# Patient Record
Sex: Female | Born: 1955 | Race: White | Hispanic: No | Marital: Married | Smoking: Never smoker
Health system: Southern US, Community
[De-identification: ages and names within clinical notes are randomized; demographics above are authoritative.]

## PROBLEM LIST (undated history)

## (undated) DIAGNOSIS — K649 Unspecified hemorrhoids: Secondary | ICD-10-CM

## (undated) DIAGNOSIS — K6289 Other specified diseases of anus and rectum: Secondary | ICD-10-CM

## (undated) DIAGNOSIS — C210 Malignant neoplasm of anus, unspecified: Secondary | ICD-10-CM

## (undated) DIAGNOSIS — I10 Essential (primary) hypertension: Secondary | ICD-10-CM

## (undated) DIAGNOSIS — M1990 Unspecified osteoarthritis, unspecified site: Secondary | ICD-10-CM

## (undated) HISTORY — PX: APPENDECTOMY: SHX54

---

## 2006-04-05 HISTORY — PX: LAPAROSCOPIC VAGINAL HYSTERECTOMY WITH SALPINGO OOPHORECTOMY: SHX6681

## 2016-08-03 HISTORY — PX: COLONOSCOPY: SHX174

## 2016-09-27 ENCOUNTER — Other Ambulatory Visit: Payer: Self-pay | Admitting: General Surgery

## 2016-09-27 NOTE — Progress Notes (Signed)
History of Present Illness Leighton Ruff MD; 4/69/6295 1:53 PM) The patient is a 61 year old female who presents with a complaint of anal problems. 61 year old female who presents to the office for evaluation of a newly diagnosed anal mass. She states that she has had rectal bleeding for several months. She was evaluated by colonoscopy and found to have 1 small ascending colon polyp which was removed as well as a mass at the anal verge which was biopsied. Biopsies show AIN grade 3. She states that her bleeding has stopped. She denies any history of abnormal Pap smears. She denies any history of sexually transmitted diseases.   Past Surgical History Malachy Moan, [place]; 09/27/2016 1:32 PM) Appendectomy Colon Polyp Removal - Colonoscopy Hysterectomy (not due to cancer) - Complete  Diagnostic Studies History Malachy Moan, RMA; 09/27/2016 1:32 PM) Colonoscopy within last year Pap Smear >5 years ago  Allergies Malachy Moan, RMA; 09/27/2016 1:33 PM) No Known Allergies 09/27/2016  Medication History Malachy Moan, RMA; 09/27/2016 1:33 PM) No Current Medications Medications Reconciled  Social History Malachy Moan, RMA; 09/27/2016 1:32 PM) Alcohol use Remotely quit alcohol use. Caffeine use Coffee, Tea. Tobacco use Never smoker.  Family History Malachy Moan, [place]; 09/27/2016 1:32 PM) Family history unknown First Degree Relatives  Pregnancy / Birth History Malachy Moan, [place]; 09/27/2016 1:32 PM) Age at menarche 24 years. Gravida 2 Maternal age 28-25 Para 2  Other Problems Malachy Moan, [place]; 09/27/2016 1:32 PM) High blood pressure     Review of Systems Malachy Moan RMA; 09/27/2016 1:32 PM) General Present- Weight Gain. Not Present- Appetite Loss, Chills, Fatigue, Fever, Night Sweats and Weight Loss. Skin Not Present- Change in Wart/Mole, Dryness, Hives, Jaundice, New Lesions, Non-Healing Wounds, Rash and Ulcer. HEENT Not  Present- Earache, Hearing Loss, Hoarseness, Nose Bleed, Oral Ulcers, Ringing in the Ears, Seasonal Allergies, Sinus Pain, Sore Throat, Visual Disturbances, Wears glasses/contact lenses and Yellow Eyes. Respiratory Not Present- Bloody sputum, Chronic Cough, Difficulty Breathing, Snoring and Wheezing. Breast Not Present- Breast Mass, Breast Pain, Nipple Discharge and Skin Changes. Cardiovascular Not Present- Chest Pain, Difficulty Breathing Lying Down, Leg Cramps, Palpitations, Rapid Heart Rate, Shortness of Breath and Swelling of Extremities. Gastrointestinal Present- Bloody Stool. Not Present- Abdominal Pain, Bloating, Change in Bowel Habits, Chronic diarrhea, Constipation, Difficulty Swallowing, Excessive gas, Gets full quickly at meals, Hemorrhoids, Indigestion, Nausea, Rectal Pain and Vomiting. Female Genitourinary Not Present- Frequency, Nocturia, Painful Urination, Pelvic Pain and Urgency. Musculoskeletal Not Present- Back Pain, Joint Pain, Joint Stiffness, Muscle Pain, Muscle Weakness and Swelling of Extremities. Neurological Not Present- Decreased Memory, Fainting, Headaches, Numbness, Seizures, Tingling, Tremor, Trouble walking and Weakness. Psychiatric Not Present- Anxiety, Bipolar, Change in Sleep Pattern, Depression, Fearful and Frequent crying. Endocrine Not Present- Cold Intolerance, Excessive Hunger, Hair Changes, Heat Intolerance, Hot flashes and New Diabetes. Hematology Not Present- Blood Thinners, Easy Bruising, Excessive bleeding, Gland problems, HIV and Persistent Infections.  Vitals Malachy Moan RMA; 09/27/2016 1:34 PM) 09/27/2016 1:33 PM Weight: 166.6 lb Height: 67in Body Surface Area: 1.87 m Body Mass Index: 26.09 kg/m  Temp.: 98.37F  Pulse: 70 (Regular)  BP: 170/80 (Sitting, Left Arm, Standard)      Physical Exam Leighton Ruff MD; 2/84/1324 1:54 PM)  General Mental Status-Alert. General Appearance-Not in acute distress. Build & Nutrition-Well  nourished. Posture-Normal posture. Gait-Normal.  Head and Neck Head-normocephalic, atraumatic with no lesions or palpable masses. Trachea-midline.  Chest and Lung Exam Chest and lung exam reveals -on auscultation, normal breath sounds, no adventitious sounds and normal vocal resonance.  Cardiovascular  Cardiovascular examination reveals -normal heart sounds, regular rate and rhythm with no murmurs and femoral artery auscultation bilaterally reveals normal pulses, no bruits, no thrills.  Abdomen Inspection Inspection of the abdomen reveals - No Hernias. Palpation/Percussion Palpation and Percussion of the abdomen reveal - Soft, Non Tender, No Rigidity (guarding), No hepatosplenomegaly and No Palpable abdominal masses.  Rectal Anorectal Exam External - normal external exam. Internal - Note: Posterior anal canal mass fixed to underlying mucosa but does not appear to be fixed to underlying muscle tissue.  Neurologic Neurologic evaluation reveals -alert and oriented x 3 with no impairment of recent or remote memory, normal attention span and ability to concentrate, normal sensation and normal coordination.  Musculoskeletal Normal Exam - Bilateral-Upper Extremity Strength Normal and Lower Extremity Strength Normal.    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 Biopsy show AIN grade 3. On physical evaluation this appears to be more advanced and invasive. 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 We will proceed with CT scan of the chest abdomen and pelvis to evaluate for metastatic disease. As long as these are negative, we will proceed to the operating room for exam under anesthesia and anal ultrasound. I will either perform an incisional biopsy or excisional biopsy depending on evaluation of the ultrasound in getting  clean margins underneath the tumor.

## 2016-09-28 ENCOUNTER — Other Ambulatory Visit (HOSPITAL_COMMUNITY): Payer: Self-pay | Admitting: General Surgery

## 2016-09-28 DIAGNOSIS — C221 Intrahepatic bile duct carcinoma: Secondary | ICD-10-CM

## 2016-10-04 ENCOUNTER — Ambulatory Visit (HOSPITAL_COMMUNITY)
Admission: RE | Admit: 2016-10-04 | Discharge: 2016-10-04 | Disposition: A | Discharge status: Home / Self Care | Payer: Self-pay | Source: Ambulatory Visit | Attending: General Surgery | Admitting: General Surgery

## 2016-10-04 ENCOUNTER — Encounter (HOSPITAL_COMMUNITY): Payer: Self-pay | Admitting: Radiology

## 2016-10-04 DIAGNOSIS — C221 Intrahepatic bile duct carcinoma: Secondary | ICD-10-CM | POA: Insufficient documentation

## 2016-10-04 DIAGNOSIS — I700 Atherosclerosis of aorta: Secondary | ICD-10-CM | POA: Insufficient documentation

## 2016-10-04 LAB — POCT I-STAT CREATININE: CREATININE: 0.8 mg/dL (ref 0.44–1.00)

## 2016-10-04 MED ORDER — IOPAMIDOL (ISOVUE-300) INJECTION 61%
100.0000 mL | Freq: Once | INTRAVENOUS | Status: AC | PRN
  Administered 2016-10-04: 100 mL via INTRAVENOUS

## 2016-10-04 MED ORDER — IOPAMIDOL (ISOVUE-300) INJECTION 61%
INTRAVENOUS | Status: AC
  Administered 2016-10-04: 100 mL via INTRAVENOUS
  Filled 2016-10-04: qty 100

## 2016-10-08 ENCOUNTER — Encounter (HOSPITAL_BASED_OUTPATIENT_CLINIC_OR_DEPARTMENT_OTHER): Payer: Self-pay | Admitting: *Deleted

## 2016-10-11 ENCOUNTER — Encounter (HOSPITAL_BASED_OUTPATIENT_CLINIC_OR_DEPARTMENT_OTHER): Payer: Self-pay | Admitting: *Deleted

## 2016-10-11 NOTE — Progress Notes (Signed)
NPO AFTER MN.  ARRIVE AT 0700.  NEEDS HG.

## 2016-10-20 DIAGNOSIS — I10 Essential (primary) hypertension: Secondary | ICD-10-CM | POA: Insufficient documentation

## 2016-10-21 ENCOUNTER — Ambulatory Visit (HOSPITAL_BASED_OUTPATIENT_CLINIC_OR_DEPARTMENT_OTHER)
Admission: RE | Admit: 2016-10-21 | Discharge: 2016-10-21 | Disposition: A | Discharge status: Home / Self Care | Payer: Self-pay | Source: Ambulatory Visit | Attending: General Surgery | Admitting: General Surgery

## 2016-10-21 ENCOUNTER — Encounter (HOSPITAL_BASED_OUTPATIENT_CLINIC_OR_DEPARTMENT_OTHER)
Admission: RE | Disposition: A | Discharge status: Home / Self Care | Payer: Self-pay | Source: Ambulatory Visit | Attending: General Surgery

## 2016-10-21 ENCOUNTER — Ambulatory Visit (HOSPITAL_BASED_OUTPATIENT_CLINIC_OR_DEPARTMENT_OTHER): Payer: Self-pay | Admitting: Anesthesiology

## 2016-10-21 ENCOUNTER — Encounter (HOSPITAL_BASED_OUTPATIENT_CLINIC_OR_DEPARTMENT_OTHER): Payer: Self-pay

## 2016-10-21 DIAGNOSIS — C211 Malignant neoplasm of anal canal: Secondary | ICD-10-CM | POA: Insufficient documentation

## 2016-10-21 HISTORY — DX: Other specified diseases of anus and rectum: K62.89

## 2016-10-21 HISTORY — PX: RECTAL EXAM UNDER ANESTHESIA: SHX6399

## 2016-10-21 LAB — POCT HEMOGLOBIN-HEMACUE: HEMOGLOBIN: 13.9 g/dL (ref 12.0–15.0)

## 2016-10-21 SURGERY — EXAM UNDER ANESTHESIA, RECTUM
Anesthesia: Monitor Anesthesia Care | Site: Anus

## 2016-10-21 MED ORDER — ACETAMINOPHEN 650 MG RE SUPP
650.0000 mg | RECTAL | Status: DC | PRN
  Filled 2016-10-21: qty 1

## 2016-10-21 MED ORDER — MIDAZOLAM HCL 2 MG/2ML IJ SOLN
INTRAMUSCULAR | Status: AC
  Filled 2016-10-21: qty 2

## 2016-10-21 MED ORDER — GABAPENTIN 300 MG PO CAPS
ORAL_CAPSULE | ORAL | Status: AC
  Filled 2016-10-21: qty 1

## 2016-10-21 MED ORDER — FENTANYL CITRATE (PF) 100 MCG/2ML IJ SOLN
INTRAMUSCULAR | Status: AC
  Filled 2016-10-21: qty 2

## 2016-10-21 MED ORDER — CELECOXIB 400 MG PO CAPS
400.0000 mg | ORAL_CAPSULE | ORAL | Status: AC
  Administered 2016-10-21: 400 mg via ORAL
  Filled 2016-10-21: qty 1

## 2016-10-21 MED ORDER — DEXAMETHASONE SODIUM PHOSPHATE 10 MG/ML IJ SOLN
INTRAMUSCULAR | Status: AC
  Filled 2016-10-21: qty 1

## 2016-10-21 MED ORDER — SODIUM CHLORIDE 0.9% FLUSH
3.0000 mL | INTRAVENOUS | Status: DC | PRN
  Filled 2016-10-21: qty 3

## 2016-10-21 MED ORDER — PROMETHAZINE HCL 25 MG/ML IJ SOLN
6.2500 mg | INTRAMUSCULAR | Status: DC | PRN
  Filled 2016-10-21: qty 1

## 2016-10-21 MED ORDER — PROPOFOL 500 MG/50ML IV EMUL
INTRAVENOUS | Status: AC
  Filled 2016-10-21: qty 50

## 2016-10-21 MED ORDER — SODIUM CHLORIDE 0.9 % IV SOLN
250.0000 mL | INTRAVENOUS | Status: DC | PRN
  Filled 2016-10-21: qty 250

## 2016-10-21 MED ORDER — SODIUM CHLORIDE 0.9% FLUSH
3.0000 mL | Freq: Two times a day (BID) | INTRAVENOUS | Status: DC
  Filled 2016-10-21: qty 3

## 2016-10-21 MED ORDER — ACETAMINOPHEN 500 MG PO TABS
1000.0000 mg | ORAL_TABLET | ORAL | Status: AC
  Administered 2016-10-21: 1000 mg via ORAL
  Filled 2016-10-21: qty 2

## 2016-10-21 MED ORDER — GABAPENTIN 300 MG PO CAPS
300.0000 mg | ORAL_CAPSULE | ORAL | Status: AC
  Administered 2016-10-21: 300 mg via ORAL
  Filled 2016-10-21: qty 1

## 2016-10-21 MED ORDER — DEXAMETHASONE SODIUM PHOSPHATE 4 MG/ML IJ SOLN
INTRAMUSCULAR | Status: DC | PRN
  Administered 2016-10-21: 5 mg via INTRAVENOUS

## 2016-10-21 MED ORDER — CELECOXIB 200 MG PO CAPS
ORAL_CAPSULE | ORAL | Status: AC
  Filled 2016-10-21: qty 2

## 2016-10-21 MED ORDER — FENTANYL CITRATE (PF) 100 MCG/2ML IJ SOLN
25.0000 ug | INTRAMUSCULAR | Status: DC | PRN
  Filled 2016-10-21: qty 1

## 2016-10-21 MED ORDER — ACETAMINOPHEN 325 MG PO TABS
650.0000 mg | ORAL_TABLET | ORAL | Status: DC | PRN
  Filled 2016-10-21: qty 2

## 2016-10-21 MED ORDER — BUPIVACAINE-EPINEPHRINE 0.5% -1:200000 IJ SOLN
INTRAMUSCULAR | Status: DC | PRN
  Administered 2016-10-21: 30 mL

## 2016-10-21 MED ORDER — MIDAZOLAM HCL 5 MG/5ML IJ SOLN
INTRAMUSCULAR | Status: DC | PRN
  Administered 2016-10-21: 2 mg via INTRAVENOUS

## 2016-10-21 MED ORDER — OXYCODONE HCL 5 MG PO TABS
5.0000 mg | ORAL_TABLET | ORAL | Status: DC | PRN
  Filled 2016-10-21: qty 2

## 2016-10-21 MED ORDER — FENTANYL CITRATE (PF) 100 MCG/2ML IJ SOLN
INTRAMUSCULAR | Status: DC | PRN
  Administered 2016-10-21: 50 ug via INTRAVENOUS

## 2016-10-21 MED ORDER — ONDANSETRON HCL 4 MG/2ML IJ SOLN
INTRAMUSCULAR | Status: AC
  Filled 2016-10-21: qty 2

## 2016-10-21 MED ORDER — ONDANSETRON HCL 4 MG/2ML IJ SOLN
INTRAMUSCULAR | Status: DC | PRN
  Administered 2016-10-21: 4 mg via INTRAVENOUS

## 2016-10-21 MED ORDER — PROPOFOL 500 MG/50ML IV EMUL
INTRAVENOUS | Status: DC | PRN
  Administered 2016-10-21: 200 ug/kg/min via INTRAVENOUS

## 2016-10-21 MED ORDER — OXYCODONE HCL 5 MG PO TABS: 5.0000 mg | ORAL_TABLET | ORAL | 0 refills | Status: DC | PRN

## 2016-10-21 MED ORDER — ACETAMINOPHEN 500 MG PO TABS
ORAL_TABLET | ORAL | Status: AC
  Filled 2016-10-21: qty 2

## 2016-10-21 MED ORDER — LACTATED RINGERS IV SOLN
INTRAVENOUS | Status: DC
  Administered 2016-10-21: 08:00:00 via INTRAVENOUS
  Filled 2016-10-21: qty 1000

## 2016-10-21 MED ORDER — LIDOCAINE 2% (20 MG/ML) 5 ML SYRINGE
INTRAMUSCULAR | Status: DC | PRN
  Administered 2016-10-21: 50 mg via INTRAVENOUS

## 2016-10-21 SURGICAL SUPPLY — 49 items
BENZOIN TINCTURE PRP APPL 2/3 (GAUZE/BANDAGES/DRESSINGS) ×3 IMPLANT
BLADE HEX COATED 2.75 (ELECTRODE) ×3 IMPLANT
BLADE SURG 10 STRL SS (BLADE) IMPLANT
BLADE SURG 15 STRL LF DISP TIS (BLADE) IMPLANT
BLADE SURG 15 STRL SS (BLADE)
BRIEF STRETCH FOR OB PAD LRG (UNDERPADS AND DIAPERS) ×3 IMPLANT
CANISTER SUCT 3000ML PPV (MISCELLANEOUS) ×3 IMPLANT
COVER BACK TABLE 60X90IN (DRAPES) ×3 IMPLANT
COVER MAYO STAND STRL (DRAPES) ×3 IMPLANT
DECANTER SPIKE VIAL GLASS SM (MISCELLANEOUS) ×3 IMPLANT
DRAPE LAPAROTOMY 100X72 PEDS (DRAPES) ×3 IMPLANT
DRAPE LG THREE QUARTER DISP (DRAPES) ×6 IMPLANT
DRAPE UNDERBUTTOCKS STRL (DRAPE) IMPLANT
DRAPE UTILITY XL STRL (DRAPES) ×3 IMPLANT
DRSG PAD ABDOMINAL 8X10 ST (GAUZE/BANDAGES/DRESSINGS) ×3 IMPLANT
ELECT BLADE 6.5 .24CM SHAFT (ELECTRODE) IMPLANT
ELECT REM PT RETURN 9FT ADLT (ELECTROSURGICAL) ×3
ELECTRODE REM PT RTRN 9FT ADLT (ELECTROSURGICAL) ×1 IMPLANT
GAUZE SPONGE 4X4 12PLY STRL (GAUZE/BANDAGES/DRESSINGS) ×3 IMPLANT
GAUZE SPONGE 4X4 12PLY STRL LF (GAUZE/BANDAGES/DRESSINGS) ×3 IMPLANT
GAUZE SPONGE 4X4 16PLY XRAY LF (GAUZE/BANDAGES/DRESSINGS) IMPLANT
GLOVE BIO SURGEON STRL SZ 6.5 (GLOVE) ×4 IMPLANT
GLOVE BIO SURGEONS STRL SZ 6.5 (GLOVE) ×2
GLOVE INDICATOR 7.0 STRL GRN (GLOVE) ×6 IMPLANT
GOWN SPEC L3 XXLG W/TWL (GOWN DISPOSABLE) ×3 IMPLANT
GOWN STRL REUS W/TWL LRG LVL3 (GOWN DISPOSABLE) ×3 IMPLANT
KIT RM TURNOVER CYSTO AR (KITS) ×3 IMPLANT
LEGGING LITHOTOMY PAIR STRL (DRAPES) IMPLANT
LOOP VESSEL MAXI BLUE (MISCELLANEOUS) IMPLANT
NDL SAFETY ECLIPSE 18X1.5 (NEEDLE) IMPLANT
NEEDLE HYPO 18GX1.5 SHARP (NEEDLE)
NEEDLE HYPO 22GX1.5 SAFETY (NEEDLE) ×3 IMPLANT
NS IRRIG 500ML POUR BTL (IV SOLUTION) ×3 IMPLANT
PACK BASIN DAY SURGERY FS (CUSTOM PROCEDURE TRAY) ×3 IMPLANT
PAD ABD 8X10 STRL (GAUZE/BANDAGES/DRESSINGS) ×3 IMPLANT
PAD ARMBOARD 7.5X6 YLW CONV (MISCELLANEOUS) IMPLANT
PENCIL BUTTON HOLSTER BLD 10FT (ELECTRODE) ×3 IMPLANT
SPONGE SURGIFOAM ABS GEL 12-7 (HEMOSTASIS) IMPLANT
SUT CHROMIC 2 0 SH (SUTURE) IMPLANT
SUT ETHIBOND 0 (SUTURE) IMPLANT
SUT GUT CHROMIC 3 0 (SUTURE) IMPLANT
SUT VIC AB 4-0 P-3 18XBRD (SUTURE) IMPLANT
SUT VIC AB 4-0 P3 18 (SUTURE)
SYR CONTROL 10ML LL (SYRINGE) ×3 IMPLANT
TOWEL OR 17X24 6PK STRL BLUE (TOWEL DISPOSABLE) ×3 IMPLANT
TRAY DSU PREP LF (CUSTOM PROCEDURE TRAY) ×3 IMPLANT
TUBE CONNECTING 12'X1/4 (SUCTIONS) ×1
TUBE CONNECTING 12X1/4 (SUCTIONS) ×2 IMPLANT
YANKAUER SUCT BULB TIP NO VENT (SUCTIONS) ×3 IMPLANT

## 2016-10-21 NOTE — Anesthesia Preprocedure Evaluation (Addendum)
Anesthesia Evaluation  Patient identified by MRN, date of birth, ID band Patient awake    Reviewed: Allergy & Precautions, NPO status , Patient's Chart, lab work & pertinent test results  Airway Mallampati: II  TM Distance: >3 FB Neck ROM: Full    Dental no notable dental hx. (+) Teeth Intact, Dental Advisory Given   Pulmonary neg pulmonary ROS,    Pulmonary exam normal breath sounds clear to auscultation       Cardiovascular Normal cardiovascular exam Rhythm:Regular Rate:Normal  Borderline hypertension   Neuro/Psych negative neurological ROS  negative psych ROS   GI/Hepatic negative GI ROS, Neg liver ROS,   Endo/Other  negative endocrine ROS  Renal/GU negative Renal ROS  negative genitourinary   Musculoskeletal negative musculoskeletal ROS (+)   Abdominal   Peds negative pediatric ROS (+)  Hematology negative hematology ROS (+)   Anesthesia Other Findings   Reproductive/Obstetrics negative OB ROS                            Anesthesia Physical Anesthesia Plan  ASA: II  Anesthesia Plan: MAC   Post-op Pain Management:    Induction: Intravenous  PONV Risk Score and Plan: 1 and Ondansetron and Propofol  Airway Management Planned: Simple Face Mask  Additional Equipment:   Intra-op Plan:   Post-operative Plan:   Informed Consent: I have reviewed the patients History and Physical, chart, labs and discussed the procedure including the risks, benefits and alternatives for the proposed anesthesia with the patient or authorized representative who has indicated his/her understanding and acceptance.   Dental advisory given  Plan Discussed with: CRNA, Surgeon and Anesthesiologist  Anesthesia Plan Comments:        Anesthesia Quick Evaluation

## 2016-10-21 NOTE — Anesthesia Procedure Notes (Signed)
Procedure Name: MAC Date/Time: 10/21/2016 9:05 AM Performed by: Wanita Chamberlain Pre-anesthesia Checklist: Patient identified, Suction available, Patient being monitored and Timeout performed Patient Re-evaluated:Patient Re-evaluated prior to induction Oxygen Delivery Method: Nasal cannula Preoxygenation: Pre-oxygenation with 100% oxygen Induction Type: IV induction Placement Confirmation: CO2 detector Dental Injury: Teeth and Oropharynx as per pre-operative assessment

## 2016-10-21 NOTE — Op Note (Addendum)
10/21/2016  9:41 AM  PATIENT:  Nicole Huynh  61 y.o. female  Patient Care Team: Elisabeth Cara, MD as PCP - General (Family Medicine)  PRE-OPERATIVE DIAGNOSIS:  anal mass  POST-OPERATIVE DIAGNOSIS:  anal mass  PROCEDURE:  ANAL EXAM UNDER ANESTHESIA, RECTAL ULTRASOUND AND EXCISIONAL BIOPSY OF ANAL MASS   Surgeon(s): Leighton Ruff, MD  ASSISTANT: none   ANESTHESIA:   local and MAC  SPECIMEN:  Source of Specimen:  L posterior anal canal mass  DISPOSITION OF SPECIMEN:  PATHOLOGY  COUNTS:  YES  PLAN OF CARE: Discharge to home after PACU  PATIENT DISPOSITION:  PACU - hemodynamically stable.  INDICATION: 61 y.o. F with bleeding anal mass.  Biopsy during colonoscopy showed AIN   OR FINDINGS:  mass in left posterior anal canal which appears to be confined to mucosa on anal [place] (see below)  DESCRIPTION: the patient was identified in the preoperative holding area and taken to the OR where they were laid on the operating room table.  MAC anesthesia was induced without difficulty. The patient was then positioned in prone jackknife position with buttocks gently taped apart.  A timeout was performed. I then easily anal ultrasound rectal probe to delineate the anal tumor. I could clearly see internal sphincter and the mass bulging the internal sphincter hours. There did not appear to be any invasion into the internal sphincter. I decided to seat with excision. The patient was then prepped and draped in usual sterile fashion.  SCDs were noted to be in place prior to the initiation of anesthesia. A surgical timeout was performed indicating the correct patient, procedure, positioning and need for preoperative antibiotics.  A rectal block was performed using Marcaine with epinephrine.    I began with a digital rectal exam.  The mass was palpated in the left posterior anal canal at the dentate line.  I then placed a Hill-Ferguson anoscope into the anal canal and evaluated this completely.  The mass  was approximately 1.5 cm in width and approximately 2 cm in length. I marked out approximately 5 mm margins using electrocautery. I then dissected the tumor off of the sphincter complex distally using Metzenbaum scissors. Once I got to the middle of the tumor I did take a portion of internal sphincter intentionally to allow for a clear posterior margin. I continued around the tumor and divided the rectal mucosa proximally also using Metzenbaum scissors. The specimen was then sent to pathology for further examination. The wound was closed using 2-0 chromic sutures. Additional Marcaine was placed under the incision site. The patient tolerated procedure well. All counts were correct per operating room staff. The patient was sent to the postanesthesia care unit in stable condition.    .     I have reviewed the [place] and the patient has no other controlled substances listed.

## 2016-10-21 NOTE — Anesthesia Postprocedure Evaluation (Signed)
Anesthesia Post Note  Patient: Nicole Huynh  Procedure(s) Performed: Procedure(s) (LRB): ANAL EXAM UNDER ANESTHESIA, RECTAL ULTRASOUND AND POSSIBLE EXCISION OF ANAL MASS (N/A)     Patient location during evaluation: PACU Anesthesia Type: MAC Level of consciousness: awake and alert Pain management: pain level controlled Vital Signs Assessment: post-procedure vital signs reviewed and stable Respiratory status: spontaneous breathing, nonlabored ventilation, respiratory function stable and patient connected to nasal cannula oxygen Cardiovascular status: stable and blood pressure returned to baseline Anesthetic complications: no    Last Vitals:  Vitals:   10/21/16 0950 10/21/16 1000  BP: 109/66 105/60  Pulse: 89 80  Resp: 16 16  Temp: 36.8 C     Last Pain:  Vitals:   10/21/16 0950  TempSrc:   PainSc: 0-No pain                 Labradford Schnitker S

## 2016-10-21 NOTE — Discharge Instructions (Addendum)
Post Anesthesia Home Care Instructions  Activity: Get plenty of rest for the remainder of the day. A responsible individual must stay with you for 24 hours following the procedure.  For the next 24 hours, DO NOT: -Drive a car -Operate machinery -Drink alcoholic beverages -Take any medication unless instructed by your physician -Make any legal decisions or sign important papers.  Meals: Start with liquid foods such as gelatin or soup. Progress to regular foods as tolerated. Avoid greasy, spicy, heavy foods. If nausea and/or vomiting occur, drink only clear liquids until the nausea and/or vomiting subsides. Call your physician if vomiting continues.  Special Instructions/Symptoms: Your throat may feel dry or sore from the anesthesia or the breathing tube placed in your throat during surgery. If this causes discomfort, gargle with warm salt water. The discomfort should disappear within 24 hours.  If you had a scopolamine patch placed behind your ear for the management of post- operative nausea and/or vomiting:  1. The medication in the patch is effective for 72 hours, after which it should be removed.  Wrap patch in a tissue and discard in the trash. Wash hands thoroughly with soap and water. 2. You may remove the patch earlier than 72 hours if you experience unpleasant side effects which may include dry mouth, dizziness or visual disturbances. 3. Avoid touching the patch. Wash your hands with soap and water after contact with the patch.    ANORECTAL SURGERY: POST OP INSTRUCTIONS 1. Take your usually prescribed home medications unless otherwise directed. 2. DIET: During the first few hours after surgery sip on some liquids until you are able to urinate.  It is normal to not urinate for several hours after this surgery.  If you feel uncomfortable, please contact the office for instructions.  After you are able to urinate,you may eat, if you feel like it.  Follow a light bland diet the first 24  hours after arrival home, such as soup, liquids, crackers, etc.  Be sure to include lots of fluids daily (6-8 glasses).  Avoid fast food or heavy meals, as your are more likely to get nauseated.  Eat a low fat diet the next few days after surgery.  Limit caffeine intake to 1-2 servings a day. 3. PAIN CONTROL: a. Pain is best controlled by a usual combination of several different methods TOGETHER: i. Muscle relaxation: Soak in a warm bath (or Sitz bath) three times a day and after bowel movements.  Continue to do this until all pain is resolved. ii. Over the counter pain medication iii. Prescription pain medication b. Most patients will experience some swelling and discomfort in the anus/rectal area and incisions.  Heat such as warm towels, sitz baths, warm baths, etc to help relax tight/sore spots and speed recovery.  Some people prefer to use ice, especially in the first couple days after surgery, as it may decrease the pain and swelling, or alternate between ice & heat.  Experiment to what works for you.  Swelling and bruising can take several weeks to resolve.  Pain can take even longer to completely resolve. c. It is helpful to take an over-the-counter pain medication regularly for the first few weeks.  Choose one of the following that works best for you: i. Naproxen (Aleve, etc)  Two 220mg tabs twice a day ii. Ibuprofen (Advil, etc) Three 200mg tabs four times a day (every meal & bedtime) d. A  prescription for pain medication (such as percocet, oxycodone, hydrocodone, etc) should be given to you   upon discharge.  Take your pain medication as prescribed.  i. If you are having problems/concerns with the prescription medicine (does not control pain, nausea, vomiting, rash, itching, etc), please call us [phone] to see if we need to switch you to a different pain medicine that will work better for you and/or control your side effect better. ii. If you need a refill on your pain medication, please  contact your pharmacy.  They will contact our office to request authorization. Prescriptions will not be filled after 5 pm or on week-ends. 4. KEEP YOUR BOWELS REGULAR and AVOID CONSTIPATION a. The goal is one to two soft bowel movements a day.  You should at least have a bowel movement every other day. b. Avoid getting constipated.  Between the surgery and the pain medications, it is common to experience some constipation. This can be very painful after rectal surgery.  Increasing fluid intake and taking a fiber supplement (such as Metamucil, Citrucel, FiberCon, etc) 1-2 times a day regularly will usually help prevent this problem from occurring.  A stool softener like colace is also recommended.  This can be purchased over the counter at your pharmacy.  You can take it up to 3 times a day.  If you do not have a bowel movement after 24 hrs since your surgery, take one does of milk of magnesia.  If you still haven't had a bowel movement 8-12 hours after that dose, take another dose.  If you don't have a bowel movement 48 hrs after surgery, purchase a Fleets enema from the drug store and administer gently per package instructions.  If you still are having trouble with your bowel movements after that, please call the office for further instructions. c. If you develop diarrhea or have many loose bowel movements, simplify your diet to bland foods & liquids for a few days.  Stop any stool softeners and decrease your fiber supplement.  Switching to mild anti-diarrheal medications (Kayopectate, Pepto Bismol) can help.  If this worsens or does not improve, please call us.  5. Wound Care a. Remove your bandages before your first bowel movement or 8 hours after surgery.     b. Remove any wound packing material at this tim,e as well.  You do not need to repack the wound unless instructed otherwise.  Wear an absorbent pad or soft cotton gauze in your underwear to catch any drainage and help keep the area clean. You  should change this every 2-3 hours while awake. c. Keep the area clean and dry.  Bathe / shower every day, especially after bowel movements.  Keep the area clean by showering / bathing over the incision / wound.   It is okay to soak an open wound to help wash it.  Wet wipes or showers / gentle washing after bowel movements is often less traumatic than regular toilet paper. d. You may have some styrofoam-like soft packing in the rectum which will come out with the first bowel movement.  e. You will often notice bleeding with bowel movements.  This should slow down by the end of the first week of surgery f. Expect some drainage.  This should slow down, too, by the end of the first week of surgery.  Wear an absorbent pad or soft cotton gauze in your underwear until the drainage stops. g. Do Not sit on a rubber or pillow ring.  This can make you symptoms worse.  You may sit on a soft pillow if needed.    6. ACTIVITIES as tolerated:   a. You may resume regular (light) daily activities beginning the next day--such as daily self-care, walking, climbing stairs--gradually increasing activities as tolerated.  If you can walk 30 minutes without difficulty, it is safe to try more intense activity such as jogging, treadmill, bicycling, low-impact aerobics, swimming, etc. b. Save the most intensive and strenuous activity for last such as sit-ups, heavy lifting, contact sports, etc  Refrain from any heavy lifting or straining until you are off narcotics for pain control.   c. You may drive when you are no longer taking prescription pain medication, you can comfortably sit for long periods of time, and you can safely maneuver your car and apply brakes. d. You may have sexual intercourse when it is comfortable.  7. FOLLOW UP in our office a. Please call [facility] at [phone] to set up an appointment to see your surgeon in the office for a follow-up appointment approximately 3-4 weeks after your surgery. b. Make sure that  you call for this appointment the day you arrive home to insure a convenient appointment time. 10. IF YOU HAVE DISABILITY OR FAMILY LEAVE FORMS, BRING THEM TO THE OFFICE FOR PROCESSING.  DO NOT GIVE THEM TO YOUR DOCTOR.     WHEN TO CALL US [phone]: 1. Poor pain control 2. Reactions / problems with new medications (rash/itching, nausea, etc)  3. Fever over 101.5 F (38.5 C) 4. Inability to urinate 5. Nausea and/or vomiting 6. Worsening swelling or bruising 7. Continued bleeding from incision. 8. Increased pain, redness, or drainage from the incision  The clinic staff is available to answer your questions during regular business hours (8:30am-5pm).  Please don't hesitate to call and ask to speak to one of our nurses for clinical concerns.   A surgeon from [facility] is always on call at the hospitals   If you have a medical emergency, go to the nearest emergency room or call 911.    [facility] [address], [address], [place], [place]  [address] ? MAIN: [phone] ? TOLL FREE: [phone] ? FAX [phone] [email or web address]    

## 2016-10-21 NOTE — Transfer of Care (Signed)
Immediate Anesthesia Transfer of Care Note  Patient: Nicole Huynh  Procedure(s) Performed: Procedure(s) with comments: ANAL EXAM UNDER ANESTHESIA, RECTAL ULTRASOUND AND POSSIBLE EXCISION OF ANAL MASS (N/A) - ULTRASOUND FROM [facility] OR NEEDED  Patient Location: PACU  Anesthesia Type:MAC  Level of Consciousness: awake, alert , oriented and patient cooperative  Airway & Oxygen Therapy: Patient Spontanous Breathing and Patient connected to nasal cannula oxygen  Post-op Assessment: Report given to RN and Post -op Vital signs reviewed and stable  Post vital signs: Reviewed and stable  Last Vitals:  Vitals:   10/21/16 0726  BP: (!) 153/88  Pulse: 78  Resp: 18  Temp: 36.6 C    Last Pain:  Vitals:   10/21/16 0726  TempSrc: Oral      Patients Stated Pain Goal: 8 (62/69/48 [id number])  Complications: No apparent anesthesia complications

## 2016-10-22 ENCOUNTER — Encounter (HOSPITAL_BASED_OUTPATIENT_CLINIC_OR_DEPARTMENT_OTHER): Payer: Self-pay | Admitting: General Surgery

## 2017-01-31 ENCOUNTER — Ambulatory Visit: Payer: Self-pay | Admitting: General Surgery

## 2017-01-31 NOTE — H&P (Signed)
esent Illness Leighton Ruff MD; 83/15/1761 12:21 PM) The patient is a 61 year old female who presents with a complaint of anal problems. 61 year old female who presents for f/u of her stage 1 anal cancer. She states that she has had no rectal bleeding. She was taken to the operating room on October 21, 2016 for an anal ultrasound and resection. Ultrasound showed no invasion into the sphincter complex. An excision of the mass was performed. 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 Since that time she has healed well. She denies any bleeding or pain. She is having regular bowel movements.   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 Womens 50+ Advanced (Oral) Active. Lisinopril (10MG  Tablet, Oral) Active. Medications Reconciled  Social History Leighton Ruff, MD; 38/18/2993 12:31 PM) Alcohol use Remotely quit alcohol use. Caffeine use Coffee, Tea. Tobacco use Never smoker.  Family History Leighton Ruff, MD; 71/69/6789 12:31 PM) Family history unknown First Degree Relatives  Pregnancy / Birth History Leighton Ruff, MD; 38/01/1750 12:31 PM) Age at menarche 89 years. Gravida 2 Maternal age 54-25 Para 2  Other Problems Leighton Ruff, MD; 02/58/5277 12:31 PM) High blood pressure     Review of Systems Leighton Ruff MD; 82/42/3536 12:31 PM) General Present- Weight Gain. Not Present- Appetite  Loss, Chills, Fatigue, Fever, Night Sweats and Weight Loss. Skin Not Present- Change in Wart/Mole, Dryness, Hives, Jaundice, New Lesions, Non-Healing Wounds, Rash and Ulcer. HEENT Not Present- Earache, Hearing Loss, Hoarseness, Nose Bleed, Oral Ulcers, Ringing in the Ears, Seasonal Allergies, Sinus Pain, Sore Throat, Visual Disturbances, Wears glasses/contact lenses and Yellow Eyes. Respiratory Not Present- Bloody sputum, Chronic Cough, Difficulty Breathing, Snoring and Wheezing. Breast Not Present- Breast Mass, Breast Pain, Nipple Discharge and Skin Changes. Cardiovascular Not Present- Chest Pain, Difficulty Breathing Lying Down, Leg Cramps, Palpitations, Rapid Heart Rate, Shortness of Breath and Swelling of Extremities. Gastrointestinal Present- Bloody Stool. Not Present- Abdominal Pain, Bloating, Change in Bowel Habits, Chronic diarrhea, Constipation, Difficulty Swallowing, Excessive gas, Gets full quickly at meals, Hemorrhoids, Indigestion, Nausea, Rectal Pain and Vomiting. Female Genitourinary Not Present- Frequency, Nocturia, Painful Urination, Pelvic Pain and Urgency. Musculoskeletal Not Present- Back Pain, Joint Pain, Joint Stiffness, Muscle Pain, Muscle Weakness and Swelling of Extremities. Neurological Not Present- Decreased Memory, Fainting, Headaches, Numbness, Seizures, Tingling, Tremor, Trouble walking and Weakness. Psychiatric Not Present- Anxiety, Bipolar, Change in Sleep Pattern, Depression, Fearful and Frequent crying. Endocrine Not Present- Cold Intolerance, Excessive Hunger, Hair Changes, Heat Intolerance, Hot flashes and New Diabetes. Hematology Not Present- Blood Thinners, Easy Bruising, Excessive bleeding, Gland problems, HIV and Persistent Infections.  Vitals Malachy Moan RMA; 01/31/2017 12:06 PM) 01/31/2017 12:05 PM Weight: 165.2 lb Height: 67in Body Surface Area: 1.86 m Body Mass Index: 25.87 kg/m  Temp.: 84F  Pulse: 73 (Regular)  BP: 136/80 (Sitting,  Left Arm, Standard)      Physical Exam Leighton Ruff MD; 14/43/1540 12:32 PM)  General Mental Status-Alert. General Appearance-Cooperative.  Chest and Lung Exam Chest and lung exam reveals -quiet, even and easy respiratory effort with no  use of accessory muscles.  Cardiovascular Cardiovascular examination reveals -normal heart sounds, regular rate and rhythm with no murmurs.  Abdomen Palpation/Percussion Palpation and Percussion of the abdomen reveal - Soft and Non Tender.  Rectal Note: Palpable mass, mobile approximately 2 cm in diameter, left posterior midline, distal rectum  Lymphatic Femoral & Inguinal  Inguinal nodes: Bilateral - Size - Note: no lymphadenopathy palpated.    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 On exam today she has a recurrent mass. I am unable to visualize this on anoscopy. We discussed performing a flexible sigmoidoscopy and getting biopsies of the area versus a repeat excisional biopsy. She has elected to proceed with excisional biopsy. We discussed that if biopsy does confirm squamous cell carcinoma, we will likely entertain the idea of postop radiation.

## 2017-02-28 ENCOUNTER — Encounter (HOSPITAL_BASED_OUTPATIENT_CLINIC_OR_DEPARTMENT_OTHER): Payer: Self-pay | Admitting: *Deleted

## 2017-02-28 ENCOUNTER — Other Ambulatory Visit: Payer: Self-pay

## 2017-02-28 NOTE — Progress Notes (Signed)
Npo after midnight arrive 530 03-04-17 [facility] no meds to take spouse driver needs ekg and I stat 4

## 2017-03-04 ENCOUNTER — Ambulatory Visit (HOSPITAL_BASED_OUTPATIENT_CLINIC_OR_DEPARTMENT_OTHER)
Admission: RE | Admit: 2017-03-04 | Discharge: 2017-03-04 | Disposition: A | Discharge status: Home / Self Care | Payer: Self-pay | Source: Ambulatory Visit | Attending: General Surgery | Admitting: General Surgery

## 2017-03-04 ENCOUNTER — Encounter (HOSPITAL_BASED_OUTPATIENT_CLINIC_OR_DEPARTMENT_OTHER)
Admission: RE | Disposition: A | Discharge status: Home / Self Care | Payer: Self-pay | Source: Ambulatory Visit | Attending: General Surgery

## 2017-03-04 ENCOUNTER — Other Ambulatory Visit: Payer: Self-pay

## 2017-03-04 ENCOUNTER — Ambulatory Visit (HOSPITAL_BASED_OUTPATIENT_CLINIC_OR_DEPARTMENT_OTHER): Payer: Self-pay | Admitting: Anesthesiology

## 2017-03-04 ENCOUNTER — Encounter (HOSPITAL_BASED_OUTPATIENT_CLINIC_OR_DEPARTMENT_OTHER): Payer: Self-pay

## 2017-03-04 DIAGNOSIS — Z86010 Personal history of colonic polyps: Secondary | ICD-10-CM | POA: Insufficient documentation

## 2017-03-04 DIAGNOSIS — Z79899 Other long term (current) drug therapy: Secondary | ICD-10-CM | POA: Insufficient documentation

## 2017-03-04 DIAGNOSIS — C211 Malignant neoplasm of anal canal: Secondary | ICD-10-CM | POA: Insufficient documentation

## 2017-03-04 DIAGNOSIS — C20 Malignant neoplasm of rectum: Secondary | ICD-10-CM | POA: Insufficient documentation

## 2017-03-04 DIAGNOSIS — I10 Essential (primary) hypertension: Secondary | ICD-10-CM | POA: Insufficient documentation

## 2017-03-04 DIAGNOSIS — Z90710 Acquired absence of both cervix and uterus: Secondary | ICD-10-CM | POA: Insufficient documentation

## 2017-03-04 DIAGNOSIS — Z9049 Acquired absence of other specified parts of digestive tract: Secondary | ICD-10-CM | POA: Insufficient documentation

## 2017-03-04 HISTORY — PX: RECTAL EXAM UNDER ANESTHESIA: SHX6399

## 2017-03-04 HISTORY — PX: RECTAL BIOPSY: SHX2303

## 2017-03-04 HISTORY — DX: Essential (primary) hypertension: I10

## 2017-03-04 LAB — POCT I-STAT 4, (NA,K, GLUC, HGB,HCT)
Glucose, Bld: 106 mg/dL — ABNORMAL HIGH (ref 65–99)
HCT: 41 % (ref 36.0–46.0)
Hemoglobin: 13.9 g/dL (ref 12.0–15.0)
POTASSIUM: 5.7 mmol/L — AB (ref 3.5–5.1)
SODIUM: 141 mmol/L (ref 135–145)

## 2017-03-04 SURGERY — EXAM UNDER ANESTHESIA, RECTUM
Anesthesia: Monitor Anesthesia Care | Site: Rectum

## 2017-03-04 MED ORDER — DEXAMETHASONE SODIUM PHOSPHATE 4 MG/ML IJ SOLN
INTRAMUSCULAR | Status: DC | PRN
  Administered 2017-03-04: 5 mg via INTRAVENOUS

## 2017-03-04 MED ORDER — PROPOFOL 500 MG/50ML IV EMUL
INTRAVENOUS | Status: AC
  Filled 2017-03-04: qty 50

## 2017-03-04 MED ORDER — OXYCODONE HCL 5 MG PO TABS: 5.0000 mg | ORAL_TABLET | ORAL | 0 refills | Status: DC | PRN

## 2017-03-04 MED ORDER — FENTANYL CITRATE (PF) 100 MCG/2ML IJ SOLN
25.0000 ug | INTRAMUSCULAR | Status: DC | PRN
  Administered 2017-03-04 (×2): 25 ug via INTRAVENOUS
  Administered 2017-03-04: 50 ug via INTRAVENOUS
  Filled 2017-03-04: qty 1

## 2017-03-04 MED ORDER — LACTATED RINGERS IV SOLN
INTRAVENOUS | Status: DC
  Administered 2017-03-04 (×2): via INTRAVENOUS
  Filled 2017-03-04: qty 1000

## 2017-03-04 MED ORDER — SODIUM CHLORIDE 0.9% FLUSH
3.0000 mL | INTRAVENOUS | Status: DC | PRN
  Filled 2017-03-04: qty 3

## 2017-03-04 MED ORDER — ACETAMINOPHEN 650 MG RE SUPP
650.0000 mg | RECTAL | Status: DC | PRN
  Filled 2017-03-04: qty 1

## 2017-03-04 MED ORDER — LIDOCAINE 2% (20 MG/ML) 5 ML SYRINGE
INTRAMUSCULAR | Status: AC
  Filled 2017-03-04: qty 5

## 2017-03-04 MED ORDER — SODIUM CHLORIDE 0.9% FLUSH
3.0000 mL | Freq: Two times a day (BID) | INTRAVENOUS | Status: DC
  Filled 2017-03-04: qty 3

## 2017-03-04 MED ORDER — KETAMINE HCL-SODIUM CHLORIDE 100-0.9 MG/10ML-% IV SOSY
PREFILLED_SYRINGE | INTRAVENOUS | Status: AC
  Filled 2017-03-04: qty 10

## 2017-03-04 MED ORDER — ONDANSETRON HCL 4 MG/2ML IJ SOLN
INTRAMUSCULAR | Status: DC | PRN
  Administered 2017-03-04: 4 mg via INTRAVENOUS

## 2017-03-04 MED ORDER — SODIUM CHLORIDE 0.9 % IV SOLN
INTRAVENOUS | Status: DC | PRN
  Administered 2017-03-04: 20 ug/kg/min via INTRAVENOUS

## 2017-03-04 MED ORDER — MIDAZOLAM HCL 5 MG/5ML IJ SOLN
INTRAMUSCULAR | Status: DC | PRN
  Administered 2017-03-04: 2 mg via INTRAVENOUS

## 2017-03-04 MED ORDER — ACETAMINOPHEN 325 MG PO TABS
650.0000 mg | ORAL_TABLET | ORAL | Status: DC | PRN
  Filled 2017-03-04: qty 2

## 2017-03-04 MED ORDER — PROPOFOL 500 MG/50ML IV EMUL
INTRAVENOUS | Status: DC | PRN
  Administered 2017-03-04: 200 ug/kg/min via INTRAVENOUS

## 2017-03-04 MED ORDER — BUPIVACAINE-EPINEPHRINE 0.5% -1:200000 IJ SOLN
INTRAMUSCULAR | Status: DC | PRN
  Administered 2017-03-04: 8 mL

## 2017-03-04 MED ORDER — KETAMINE HCL 10 MG/ML IJ SOLN
INTRAMUSCULAR | Status: DC | PRN
  Administered 2017-03-04 (×2): 10 mg via INTRAVENOUS

## 2017-03-04 MED ORDER — LIDOCAINE HCL (CARDIAC) 20 MG/ML IV SOLN
INTRAVENOUS | Status: DC | PRN
  Administered 2017-03-04: 50 mg via INTRAVENOUS

## 2017-03-04 MED ORDER — ONDANSETRON HCL 4 MG/2ML IJ SOLN
4.0000 mg | Freq: Once | INTRAMUSCULAR | Status: DC | PRN
  Filled 2017-03-04: qty 2

## 2017-03-04 MED ORDER — FENTANYL CITRATE (PF) 100 MCG/2ML IJ SOLN
INTRAMUSCULAR | Status: AC
  Filled 2017-03-04: qty 2

## 2017-03-04 MED ORDER — OXYCODONE HCL 5 MG PO TABS
5.0000 mg | ORAL_TABLET | ORAL | Status: DC | PRN
  Filled 2017-03-04: qty 2

## 2017-03-04 MED ORDER — KETOROLAC TROMETHAMINE 30 MG/ML IJ SOLN
INTRAMUSCULAR | Status: AC
  Filled 2017-03-04: qty 1

## 2017-03-04 MED ORDER — SODIUM CHLORIDE 0.9 % IV SOLN
250.0000 mL | INTRAVENOUS | Status: DC | PRN
  Filled 2017-03-04: qty 250

## 2017-03-04 MED ORDER — MIDAZOLAM HCL 2 MG/2ML IJ SOLN
INTRAMUSCULAR | Status: AC
  Filled 2017-03-04: qty 2

## 2017-03-04 SURGICAL SUPPLY — 61 items
BENZOIN TINCTURE PRP APPL 2/3 (GAUZE/BANDAGES/DRESSINGS) IMPLANT
BLADE HEX COATED 2.75 (ELECTRODE) ×3 IMPLANT
BLADE SURG 10 STRL SS (BLADE) IMPLANT
BLADE SURG 15 STRL LF DISP TIS (BLADE) IMPLANT
BLADE SURG 15 STRL SS (BLADE)
BRIEF STRETCH FOR OB PAD LRG (UNDERPADS AND DIAPERS) ×3 IMPLANT
CANISTER SUCT 3000ML PPV (MISCELLANEOUS) ×3 IMPLANT
COVER BACK TABLE 60X90IN (DRAPES) ×3 IMPLANT
COVER MAYO STAND STRL (DRAPES) ×3 IMPLANT
DECANTER SPIKE VIAL GLASS SM (MISCELLANEOUS) ×3 IMPLANT
DRAPE LAPAROTOMY 100X72 PEDS (DRAPES) ×3 IMPLANT
DRAPE LG THREE QUARTER DISP (DRAPES) ×6 IMPLANT
DRAPE UNDERBUTTOCKS STRL (DRAPE) IMPLANT
DRAPE UTILITY XL STRL (DRAPES) ×3 IMPLANT
ELECT BLADE 6.5 .24CM SHAFT (ELECTRODE) ×3 IMPLANT
ELECT REM PT RETURN 9FT ADLT (ELECTROSURGICAL) ×3
ELECTRODE REM PT RTRN 9FT ADLT (ELECTROSURGICAL) ×1 IMPLANT
GAUZE SPONGE 4X4 12PLY STRL (GAUZE/BANDAGES/DRESSINGS) ×3 IMPLANT
GAUZE SPONGE 4X4 12PLY STRL LF (GAUZE/BANDAGES/DRESSINGS) ×3 IMPLANT
GAUZE SPONGE 4X4 16PLY XRAY LF (GAUZE/BANDAGES/DRESSINGS) IMPLANT
GLOVE BIO SURGEON STRL SZ 6.5 (GLOVE) ×4 IMPLANT
GLOVE BIO SURGEONS STRL SZ 6.5 (GLOVE) ×2
GLOVE INDICATOR 7.0 STRL GRN (GLOVE) ×6 IMPLANT
GOWN SPEC L3 XXLG W/TWL (GOWN DISPOSABLE) ×3 IMPLANT
GOWN STRL REUS W/TWL LRG LVL3 (GOWN DISPOSABLE) ×3 IMPLANT
HYDROGEN PEROXIDE 16OZ (MISCELLANEOUS) ×3 IMPLANT
KIT RM TURNOVER CYSTO AR (KITS) ×3 IMPLANT
LEGGING LITHOTOMY PAIR STRL (DRAPES) ×3 IMPLANT
LOOP VESSEL MAXI BLUE (MISCELLANEOUS) IMPLANT
NDL SAFETY ECLIPSE 18X1.5 (NEEDLE) IMPLANT
NEEDLE HYPO 18GX1.5 SHARP (NEEDLE)
NEEDLE HYPO 22GX1.5 SAFETY (NEEDLE) ×3 IMPLANT
NS IRRIG 500ML POUR BTL (IV SOLUTION) ×3 IMPLANT
PACK BASIN DAY SURGERY FS (CUSTOM PROCEDURE TRAY) ×3 IMPLANT
PAD ABD 8X10 STRL (GAUZE/BANDAGES/DRESSINGS) ×3 IMPLANT
PAD ARMBOARD 7.5X6 YLW CONV (MISCELLANEOUS) IMPLANT
PENCIL BUTTON HOLSTER BLD 10FT (ELECTRODE) ×3 IMPLANT
SPONGE GAUZE 4X4 12PLY STER LF (GAUZE/BANDAGES/DRESSINGS) ×3 IMPLANT
SPONGE HEMORRHOID 8X3CM (HEMOSTASIS) IMPLANT
SPONGE SURGIFOAM ABS GEL 12-7 (HEMOSTASIS) ×3 IMPLANT
SUCTION FRAZIER HANDLE 10FR (MISCELLANEOUS)
SUCTION TUBE FRAZIER 10FR DISP (MISCELLANEOUS) IMPLANT
SUT CHROMIC 2 0 SH (SUTURE) ×3 IMPLANT
SUT CHROMIC 3 0 SH 27 (SUTURE) IMPLANT
SUT ETHIBOND 0 (SUTURE) IMPLANT
SUT GUT CHROMIC 3 0 (SUTURE) IMPLANT
SUT SILK 2 0 (SUTURE)
SUT SILK 2-0 18XBRD TIE 12 (SUTURE) IMPLANT
SUT VIC AB 2-0 SH 27 (SUTURE) ×2
SUT VIC AB 2-0 SH 27XBRD (SUTURE) ×1 IMPLANT
SUT VIC AB 3-0 SH 18 (SUTURE) IMPLANT
SUT VIC AB 3-0 SH 27 (SUTURE) ×2
SUT VIC AB 3-0 SH 27X BRD (SUTURE) ×1 IMPLANT
SUT VIC AB 4-0 P-3 18XBRD (SUTURE) IMPLANT
SUT VIC AB 4-0 P3 18 (SUTURE)
SYR CONTROL 10ML LL (SYRINGE) ×3 IMPLANT
TOWEL OR 17X24 6PK STRL BLUE (TOWEL DISPOSABLE) ×3 IMPLANT
TRAY DSU PREP LF (CUSTOM PROCEDURE TRAY) ×3 IMPLANT
TUBE CONNECTING 12'X1/4 (SUCTIONS) ×1
TUBE CONNECTING 12X1/4 (SUCTIONS) ×2 IMPLANT
YANKAUER SUCT BULB TIP NO VENT (SUCTIONS) ×3 IMPLANT

## 2017-03-04 NOTE — Discharge Instructions (Addendum)

## 2017-03-04 NOTE — Op Note (Signed)
03/04/2017  8:14 AM  PATIENT:  Nicole Huynh  61 y.o. female  Patient Care Team: Elisabeth Cara, MD as PCP - General (Family Medicine)  PRE-OPERATIVE DIAGNOSIS:  DISTAL RECTAL MASS  POST-OPERATIVE DIAGNOSIS:  DISTAL RECTAL MASS  PROCEDURE:   ANAL EXAM UNDER ANESTHESIA TRANSANAL EXCISIONAL BIOPSY DISTAL RECTAL MASS  SURGEON:  Surgeon(s): Leighton Ruff, MD  ASSISTANT: none   ANESTHESIA:   local and MAC  SPECIMEN:  Source of Specimen:  distal rectal mass, h/o anal SCC  DISPOSITION OF SPECIMEN:  PATHOLOGY  COUNTS:  YES  PLAN OF CARE: Discharge to home after PACU  PATIENT DISPOSITION:  PACU - hemodynamically stable.  INDICATION: 61 y.o. F treated with transanal excision of distal rectal squamous cell carcinoma approximately 4 months ago.  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  Her situation was discussed in the GI tumor board and we decided to continue with aggressive surveillance.  On her first examination she had no signs of recurrence but on her repeat 108-monthexamination she had an enlarging, mobile, distal rectal mass.  I recommended excision or biopsy.  She decided to proceed with excision transanally.   OR FINDINGS: Mobile mass in the left posterior distal rectum abutting the anal canal.  DESCRIPTION: the patient was identified in the preoperative holding area and taken to the OR where they were laid on the operating room table.  MAC anesthesia was induced without difficulty. The patient was then positioned in lithotomy position.  The patient was then prepped and draped in usual sterile fashion.  SCDs were noted to be in place prior to the initiation of anesthesia. A surgical timeout was performed indicating the correct patient, procedure, positioning and need for preoperative antibiotics.  A rectal block was performed using Marcaine with epinephrine.    I began with a digital rectal exam.  The mass was palpated in the left posterior  distal rectum and was not fixed to underlying tissue.  I then placed a Hill-Ferguson anoscope into the anal canal and evaluated this completely.  There was a small dimple in the mucosa but no other signs of ulceration.  I began by marking the borders of the mass with Bovie electrocautery within the rectal mucosa.  I then used electrocautery to divide the rectum just proximal to the dentate line and enter into the presacral space.  I then dissected underneath of the mass using electrocautery and came around the lateral edges also with electrocautery.  I was then able to pull the mass out of the anal canal and divide the proximal margin.  The mass was then sent to pathology for further examination.  I closed the edges of the rectal mucosa using interrupted 3-0 Vicryl sutures for the lateral edges and 2-0 chromic suture for the distal edge.  The patient had quite a bit of redundant mucosa so I performed a rigid proctoscopy to confirm patency and make sure that we had not strictured her lumen.  There was no sign of this on a proctoscopic exam.  I placed a piece of Gelfoam over the sutured edge and injected the remaining Marcaine solution into the anal canal surrounding her incision.

## 2017-03-04 NOTE — Anesthesia Procedure Notes (Addendum)
Procedure Name: MAC Date/Time: 03/04/2017 7:30 AM Performed by: Wanita Chamberlain, CRNA Pre-anesthesia Checklist: Patient identified, Timeout performed, Emergency Drugs available, Suction available and Patient being monitored Patient Re-evaluated:Patient Re-evaluated prior to induction Oxygen Delivery Method: Nasal cannula Preoxygenation: Pre-oxygenation with 100% oxygen Induction Type: IV induction Placement Confirmation: CO2 detector and positive ETCO2

## 2017-03-04 NOTE — Anesthesia Postprocedure Evaluation (Signed)
Anesthesia Post Note  Patient: Nicole Huynh  Procedure(s) Performed: ANAL EXAM UNDER ANESTHESIA (N/A Rectum) TRANSANAL EXCISIONAL BIOPSY DISTAL RECTAL MASS (N/A Rectum)     Patient location during evaluation: PACU Anesthesia Type: MAC Level of consciousness: awake and alert Pain management: pain level controlled Vital Signs Assessment: post-procedure vital signs reviewed and stable Respiratory status: spontaneous breathing, nonlabored ventilation and respiratory function stable Cardiovascular status: stable and blood pressure returned to baseline Postop Assessment: no apparent nausea or vomiting Anesthetic complications: no    Last Vitals:  Vitals:   03/04/17 0900 03/04/17 0915  BP: 136/74 131/83  Pulse: 72 68  Resp: 13 15  Temp:    SpO2: 99% 96%    Last Pain:  Vitals:   03/04/17 0915  TempSrc:   PainSc: 5                  Catalina Gravel

## 2017-03-04 NOTE — Transfer of Care (Signed)
Immediate Anesthesia Transfer of Care Note  Patient: Nicole Huynh  Procedure(s) Performed: ANAL EXAM UNDER ANESTHESIA (N/A Rectum) TRANSANAL EXCISIONAL BIOPSY DISTAL RECTAL MASS (N/A Rectum)  Patient Location: PACU  Anesthesia Type:MAC  Level of Consciousness: awake, alert , oriented and patient cooperative  Airway & Oxygen Therapy: Patient Spontanous Breathing and Patient connected to nasal cannula oxygen  Post-op Assessment: Report given to RN and Post -op Vital signs reviewed and stable  Post vital signs: Reviewed and stable  Last Vitals:  Vitals:   03/04/17 0545  BP: (!) 145/90  Pulse: 77  Resp: 18  Temp: 36.9 C  SpO2: 99%    Last Pain:  Vitals:   03/04/17 0545  TempSrc: Oral      Patients Stated Pain Goal: 5 (41/63/84 5364)  Complications: No apparent anesthesia complications

## 2017-03-04 NOTE — Anesthesia Preprocedure Evaluation (Addendum)
Anesthesia Evaluation  Patient identified by MRN, date of birth, ID band Patient awake    Reviewed: Allergy & Precautions, NPO status , Patient's Chart, lab work & pertinent test results  Airway Mallampati: II  TM Distance: >3 FB Neck ROM: Full    Dental  (+) Teeth Intact, Dental Advisory Given   Pulmonary neg pulmonary ROS,    Pulmonary exam normal breath sounds clear to auscultation       Cardiovascular hypertension, Pt. on medications Normal cardiovascular exam Rhythm:Regular Rate:Normal     Neuro/Psych negative neurological ROS  negative psych ROS   GI/Hepatic Neg liver ROS, Rectal   Endo/Other  negative endocrine ROS  Renal/GU negative Renal ROS     Musculoskeletal negative musculoskeletal ROS (+)   Abdominal   Peds  Hematology negative hematology ROS (+)   Anesthesia Other Findings Day of surgery medications reviewed with the patient.  Reproductive/Obstetrics                             Anesthesia Physical Anesthesia Plan  ASA: II  Anesthesia Plan: MAC   Post-op Pain Management:    Induction: Intravenous  PONV Risk Score and Plan: 2 and Propofol infusion and Ondansetron  Airway Management Planned: Nasal Cannula  Additional Equipment:   Intra-op Plan:   Post-operative Plan:   Informed Consent: I have reviewed the patients History and Physical, chart, labs and discussed the procedure including the risks, benefits and alternatives for the proposed anesthesia with the patient or authorized representative who has indicated his/her understanding and acceptance.   Dental advisory given  Plan Discussed with: CRNA and Anesthesiologist  Anesthesia Plan Comments: (Discussed risks/benefits/alternatives to MAC sedation including need for ventilatory support, hypotension, need for conversion to general anesthesia.  All patient questions answered.  Patient/guardian wishes to  proceed.)        Anesthesia Quick Evaluation

## 2017-03-04 NOTE — H&P (Addendum)
The patient is a 61 year old female who presents with a complaint of anal problems. 61 year old female who presents for f/u of her stage 1 anal cancer. She states that she has had no rectal bleeding. She was taken to the operating room on October 21, 2016 for an anal ultrasound and resection. Ultrasound showed no invasion into the sphincter complex. An excision of the mass was performed. 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 Since that time she has healed well. She denies any bleeding or pain. She is having regular bowel movements.   Problem List/Past Medical Leighton Ruff, MD; 97/98/9211 12:31 PM) CANCER OF ANAL CANAL (C21.1)  Past Surgical History Leighton Ruff, MD; 94/17/4081 12:31 PM) Appendectomy Colon Polyp Removal - Colonoscopy Hysterectomy (not due to cancer) - Complete  Diagnostic Studies History Leighton Ruff, MD; 44/81/8563 12:31 PM) Colonoscopy within last year Pap Smear >5 years ago  Allergies Malachy Moan, RMA; 01/31/2017 12:04 PM) No Known Allergies 09/27/2016  Medication History Malachy Moan, RMA; 01/31/2017 12:05 PM) Vitamin D3 (1000UNIT Tablet, Oral) Active. Womens 50+ Advanced (Oral) Active. Lisinopril (10MG  Tablet, Oral) Active. Medications Reconciled  Social History Leighton Ruff, MD; 14/97/0263 12:31 PM) Alcohol use Remotely quit alcohol use. Caffeine use Coffee, Tea. Tobacco use Never smoker.  Family History Leighton Ruff, MD; 78/58/8502 12:31 PM) Family history unknown First Degree Relatives  Pregnancy / Birth History Leighton Ruff, MD; 77/41/2878 12:31 PM) Age at menarche 14 years. Gravida 2 Maternal age 29-25 Para 2  Other Problems Leighton Ruff, MD; 67/67/2094 12:31 PM) High blood pressure     Review of Systems  General Present- Weight Gain. Not Present- Appetite Loss, Chills, Fatigue, Fever, Night Sweats and Weight Loss. Skin Not Present-  Change in Wart/Mole, Dryness, Hives, Jaundice, New Lesions, Non-Healing Wounds, Rash and Ulcer. HEENT Not Present- Earache, Hearing Loss, Hoarseness, Nose Bleed, Oral Ulcers, Ringing in the Ears, Seasonal Allergies, Sinus Pain, Sore Throat, Visual Disturbances, Wears glasses/contact lenses and Yellow Eyes. Respiratory Not Present- Bloody sputum, Chronic Cough, Difficulty Breathing, Snoring and Wheezing. Breast Not Present- Breast Mass, Breast Pain, Nipple Discharge and Skin Changes. Cardiovascular Not Present- Chest Pain, Difficulty Breathing Lying Down, Leg Cramps, Palpitations, Rapid Heart Rate, Shortness of Breath and Swelling of Extremities. Gastrointestinal Present- Bloody Stool. Not Present- Abdominal Pain, Bloating, Change in Bowel Habits, Chronic diarrhea, Constipation, Difficulty Swallowing, Excessive gas, Gets full quickly at meals, Hemorrhoids, Indigestion, Nausea, Rectal Pain and Vomiting. Female Genitourinary Not Present- Frequency, Nocturia, Painful Urination, Pelvic Pain and Urgency. Musculoskeletal Not Present- Back Pain, Joint Pain, Joint Stiffness, Muscle Pain, Muscle Weakness and Swelling of Extremities. Neurological Not Present- Decreased Memory, Fainting, Headaches, Numbness, Seizures, Tingling, Tremor, Trouble walking and Weakness. Psychiatric Not Present- Anxiety, Bipolar, Change in Sleep Pattern, Depression, Fearful and Frequent crying. Endocrine Not Present- Cold Intolerance, Excessive Hunger, Hair Changes, Heat Intolerance, Hot flashes and New Diabetes. Hematology Not Present- Blood Thinners, Easy Bruising, Excessive bleeding, Gland problems, HIV and Persistent Infections.  Vitals Malachy Moan RMA; 01/31/2017 12:06 PM) 01/31/2017 12:05 PM Weight: 165.2 lb Height: 67in Body Surface Area: 1.86 m Body Mass Index: 25.87 kg/m  Temp.: 65F  Pulse: 73 (Regular)  BP: 136/80 (Sitting, Left Arm, Standard)      Physical Exam Leighton Ruff MD;  70/96/2836 12:32 PM)  General Mental Status-Alert. General Appearance-Cooperative.  Chest and Lung Exam Chest and lung exam reveals -quiet, even and easy respiratory effort with no use of accessory muscles.  Cardiovascular Cardiovascular examination reveals -normal heart sounds, regular  rate and rhythm with no murmurs.  Abdomen Palpation/Percussion Palpation and Percussion of the abdomen reveal - Soft and Non Tender.  Rectal Note: Palpable mass, mobile approximately 2 cm in diameter, left posterior midline, distal rectum  Lymphatic Femoral & Inguinal  Inguinal nodes: Bilateral - Size - Note: no lymphadenopathy palpated.    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 On exam today she has a recurrent mass. I am unable to visualize this on anoscopy. We discussed performing a flexible sigmoidoscopy and getting biopsies of the area versus a repeat excisional biopsy. She has elected to proceed with excisional biopsy. We discussed that if biopsy does confirm squamous cell carcinoma, we will likely entertain the idea of postop radiation.  I have reviewed the [employer] and there are no other narcotic prescriptions except for the one I gave her in July for her previous surgery.

## 2017-03-07 ENCOUNTER — Encounter (HOSPITAL_BASED_OUTPATIENT_CLINIC_OR_DEPARTMENT_OTHER): Payer: Self-pay | Admitting: General Surgery

## 2017-03-09 NOTE — Progress Notes (Addendum)
GI Location of Tumor / Histology: Rectal (anal canal) recurrent mass  Nicole Huynh presented  months ago with symptoms of:   Biopsies of  (if applicable) revealed:Diagnosis 10/21/16: initial dx: Anus, biopsy, canal - INVASIVE SQUAMOUS CELL CARCINOMA WITH BASALOID FEATURES, MODERATELY DIFFERENTIATED, SPANNING APPROXIMATELY 1.5 CM. - RESECTION MARGINS ARE NEGATIVE FOR INVASIVE CARCINOMA. - HIGH GRADE SQUAMOUS INTRAEPITHELIAL LESION (AIN-III, SEVERE DYSPLASIA/CIS) INVOLVES ANAL MARGIN. - LYMPHOVASCULAR INVASION PRESENT.    Diagnosis 03/04/2017: Rectum, resection, distal mass - INVASIVE SQUAMOUS CELL CARCINOMA INVOLVING SURGICAL MARGIN OF RESECTION.  Past/Anticipated interventions by surgeon, if any: 03/04/17: bx transanal Dr. Leighton Ruff, MD  Past/Anticipated interventions by medical oncology, if any: Not scheduled as yet  Weight changes, if any: No  Bowel/Bladder complaints, if any: regular  Both, som discharge from rectum after surgery stated  Nausea / Vomiting, if any:no  Pain issues, if any:  no  Any blood per rectum:   no  SAFETY ISSUES:no  Prior radiation? no  Pacemaker/ICD?no  Possible current pregnancy? NO  Is the patient on methotrexate? no  Current complaints, details: depression, anxiety  Married, 2 children lost one,  Mother breast cancer dx late 70's deceased age 21, father heart problems ,deceased Allergies:NKA

## 2017-03-10 ENCOUNTER — Telehealth: Payer: Self-pay | Admitting: *Deleted

## 2017-03-10 ENCOUNTER — Encounter: Payer: Self-pay | Admitting: General Practice

## 2017-03-10 ENCOUNTER — Encounter: Payer: Self-pay | Admitting: Radiation Oncology

## 2017-03-10 ENCOUNTER — Ambulatory Visit
Admission: RE | Admit: 2017-03-10 | Discharge: 2017-03-10 | Disposition: A | Discharge status: Home / Self Care | Payer: Self-pay | Source: Ambulatory Visit | Attending: Radiation Oncology | Admitting: Radiation Oncology

## 2017-03-10 VITALS — BP 142/71 | HR 89 | Temp 98.1°F | Resp 20 | Ht 67.0 in | Wt 167.6 lb

## 2017-03-10 DIAGNOSIS — I10 Essential (primary) hypertension: Secondary | ICD-10-CM | POA: Insufficient documentation

## 2017-03-10 DIAGNOSIS — C218 Malignant neoplasm of overlapping sites of rectum, anus and anal canal: Secondary | ICD-10-CM

## 2017-03-10 DIAGNOSIS — Z79899 Other long term (current) drug therapy: Secondary | ICD-10-CM | POA: Insufficient documentation

## 2017-03-10 DIAGNOSIS — C211 Malignant neoplasm of anal canal: Secondary | ICD-10-CM

## 2017-03-10 DIAGNOSIS — C210 Malignant neoplasm of anus, unspecified: Secondary | ICD-10-CM | POA: Insufficient documentation

## 2017-03-10 NOTE — Progress Notes (Signed)
Radiation Oncology         [phone] ________________________________  Name: Nicole Huynh        MRN: [id number]  Date of Service: 03/10/2017 DOB: Jul 16, 1955  OV:FIEP, Gwyndolyn Saxon, MD  Leighton Ruff, MD     REFERRING PHYSICIAN: Leighton Ruff, MD   DIAGNOSIS: The encounter diagnosis was Cancer of overlapping sites of rectum, anus and anal canal ([place]).   HISTORY OF PRESENT ILLNESS: Nicole Huynh is a 61 y.o. female seen at the request of Dr. Marcello Moores for anal cancer. Initially, the patient reports that she saw Dr Prince Solian in May 2018, and was referred to Dr Marcello Moores at that time who evaluated her and recommended removal of the mass. The patient underwent anal ultrasound and transanal resection for a mass within the left posterior distal rectum. [place] did not show invasion into the sphincter complex at that time. 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  Since then, the patient has maintained follow up with Dr Manon Hilding office routinely and unfortunately during her follow up at the end of November there was a palpable, mobile ~2cm mass within the left posterior distal rectum, per Dr Manon Hilding exam. This was not able to visualized on anoscopy. 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 Surgical margins were involved by this carcinoma. With this, Dr Marcello Moores has referred her into radiation oncology to discuss her potential treatment options in the ongoing management of her anal cancer. She has had a staging CT CAP which did not show evidence of metastatic disease. Of note, she has had routine pap smears prior to her hysterectomy ~9 years ago, and she states that none of these have ever been abnormal. She had this hysterectomy performed because of uterine polyps and at the recommendation of her OBGYN.    PREVIOUS RADIATION THERAPY:  No  PAST MEDICAL HISTORY:  Past Medical History:  Diagnosis Date  . Hypertension   . Rectal mass        PAST SURGICAL HISTORY: Past Surgical History:  Procedure Laterality Date  . APPENDECTOMY  yrs ago  . COLONOSCOPY  08/2016  . LAPAROSCOPIC VAGINAL HYSTERECTOMY WITH SALPINGO OOPHORECTOMY Bilateral 2008  . RECTAL BIOPSY N/A 03/04/2017   Procedure: TRANSANAL EXCISIONAL BIOPSY DISTAL RECTAL MASS;  Surgeon: Leighton Ruff, MD;  Location: [facility];  Service: General;  Laterality: N/A;  . RECTAL EXAM UNDER ANESTHESIA N/A 10/21/2016   Procedure: ANAL EXAM UNDER ANESTHESIA, RECTAL ULTRASOUND AND POSSIBLE EXCISION OF ANAL MASS;  Surgeon: Leighton Ruff, MD;  Location: [facility];  Service: General;  Laterality: N/A;  ULTRASOUND FROM [facility] OR NEEDED  . RECTAL EXAM UNDER ANESTHESIA N/A 03/04/2017   Procedure: ANAL EXAM UNDER ANESTHESIA;  Surgeon: Leighton Ruff, MD;  Location: [facility];  Service: General;  Laterality: N/A;     FAMILY HISTORY:  Family History  Problem Relation Age of Onset  . Cancer Neg Hx      SOCIAL HISTORY:  reports that  has never smoked. she has never used smokeless tobacco. She reports that she does not drink alcohol or use drugs. She currently lives in [place], [place] with her husband.    ALLERGIES: Patient has no known allergies.   MEDICATIONS:  Current Outpatient Medications  Medication Sig Dispense Refill  . Cholecalciferol (VITAMIN D-3) 1000 units CAPS Take by mouth daily.    Marland Kitchen  lisinopril (PRINIVIL,ZESTRIL) 10 MG tablet Take 10 mg by mouth daily.    . Multiple Vitamin (MULTIVITAMIN) tablet Take 1 tablet by mouth daily.    Marland Kitchen oxyCODONE (OXY IR/ROXICODONE) 5 MG immediate release tablet Take 1 tablet (5 mg total) by mouth every 4 (four) hours as needed. (Patient not taking: Reported on 03/10/2017) 10 tablet 0   No current facility-administered medications for this encounter.      REVIEW OF SYSTEMS: On review of  systems, the patient reports that she is doing well overall. She denies any chest pain, shortness of breath, cough, fevers, chills, night sweats, unintended weight changes. She denies any recent hematochezia, rectal or abdominal pain. She does report some clear discharge from the rectum, but she otherwise has been asymptomatic. She denies any vaginal complaints, such as vaginal discharge, bleeding, pain. She denies any bladder disturbances, and denies nausea or vomiting. She denies any new musculoskeletal or joint aches or pains. A complete review of systems is obtained and is otherwise negative.  PHYSICAL EXAM:  Wt Readings from Last 3 Encounters:  03/10/17 167 lb 9.6 oz (76 kg)  03/04/17 166 lb (75.3 kg)  10/21/16 166 lb (75.3 kg)   Temp Readings from Last 3 Encounters:  03/10/17 98.1 F (36.7 C) (Oral)  03/04/17 98.3 F (36.8 C)  10/21/16 98.2 F (36.8 C)   BP Readings from Last 3 Encounters:  03/10/17 (!) 142/71  03/04/17 (!) 138/94  10/21/16 109/81   Pulse Readings from Last 3 Encounters:  03/10/17 89  03/04/17 76  10/21/16 72   Pain Assessment Pain Score: 0-No pain/10  In general this is a well appearing caucasian female in no acute distress. She is alert and oriented x4 and appropriate throughout the examination. HEENT reveals that the patient is normocephalic, atraumatic. EOMs are intact. PERRLA. Skin is intact without any evidence of gross lesions. Cardiopulmonary assessment is negative for acute distress and she exhibits normal effort. The remainder of her exam is deferred.   ECOG = 0  0 - Asymptomatic (Fully active, able to carry on all predisease activities without restriction)  1 - Symptomatic but completely ambulatory (Restricted in physically strenuous activity but ambulatory and able to carry out work of a light or sedentary nature. For example, light housework, office work)  2 - Symptomatic, <50% in bed during the day (Ambulatory and capable of all self care but  unable to carry out any work activities. Up and about more than 50% of waking hours)  3 - Symptomatic, >50% in bed, but not bedbound (Capable of only limited self-care, confined to bed or chair 50% or more of waking hours)  4 - Bedbound (Completely disabled. Cannot carry on any self-care. Totally confined to bed or chair)  5 - Death   Eustace Pen MM, Creech RH, Tormey DC, et al. [phone]). "Toxicity and response criteria of the [facility]". [place] Oncol. 5 (6): 649-55    LABORATORY DATA:  Lab Results  Component Value Date   HGB 13.9 03/04/2017   HCT 41.0 03/04/2017   Lab Results  Component Value Date   NA 141 03/04/2017   K 5.7 (H) 03/04/2017   No results found for: ALT, AST, GGT, ALKPHOS, BILITOT    RADIOGRAPHY: No results found.     IMPRESSION/PLAN: 1. 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 Dr Lisbeth Renshaw discussed the pathology, imaging, and the nature of her disease in regards to her treatments thus far in great detail. He recommends a  course of concurrent chemotherapy and radiation and would recommend a 5.5 to 6 week course of radiotherapy. The risks vs benefits, long and short side effects, and potential toxicities were discussed at great length with the patient. I informed them that they may experience possible fatigue, looser stools, and radiation-related skin changes within their treatment field. The patient appears to understand and wishes to proceed with planned course of treatment. She is however interested in having treatment closer to home. We will coordinate a PET scan and set her up for a referral to medical and radiation oncology at [facility].  The above documentation reflects my direct findings during this shared patient visit. Please see the separate note by Dr. Lisbeth Renshaw on this date for the remainder of the patient's plan of care.    Carola Rhine, PAC  This document serves as a record of services personally  performed by the care team consisting of Kyung Rudd, MD and Leonides Schanz, PA-C. It was created on their behalf by Reola Mosher, a trained medical scribe. The creation of this record is based on the scribe's personal observations and the provider's statements to them. This document has been checked and approved by the attending provider.

## 2017-03-10 NOTE — Telephone Encounter (Signed)
Called left voic and see if she can come an hour earlier at 230, cancellation today 11:05 AM

## 2017-03-10 NOTE — Progress Notes (Signed)
Please see the Nurse Progress Note in the MD Initial Consult Encounter for this patient. 

## 2017-03-10 NOTE — Progress Notes (Signed)
[facility] Psychosocial Distress Screening Clinical Social Work  Clinical Social Work was referred by distress screening protocol.  The patient scored a 5 on the Psychosocial Distress Thermometer which indicates moderate distress. Clinical Social Worker Edwyna Shell to assess for distress and other psychosocial needs. CSW and patient discussed common feeling and emotions when being diagnosed with cancer, and the importance of support during treatment. CSW informed patient of the support team and support services at [facility]. CSW provided contact information and encouraged patient to call with any questions or concerns.  Patient overwhelmed by new diagnosis of cancer, encouraged patient to check in next week after having time to process emotions common to new diagnosis and treatment plan. Stressed importance of stress management during process of cancer treatment.    ONCBCN DISTRESS SCREENING 03/10/2017  Screening Type Initial Screening  Distress experienced in past week (1-10) 5  Emotional problem type Depression;Nervousness/Anxiety;Feeling hopeless;Boredom  Information Concerns Type Lack of info about diagnosis;Lack of info about treatment  Physical Problem type Sleep/insomnia  Physician notified of physical symptoms Yes  Referral to clinical social work Yes    Clinical Social Worker follow up needed: No.  If yes, follow up plan:   Edwyna Shell, LCSW Clinical Social Worker Phone:  [phone]

## 2017-03-11 ENCOUNTER — Telehealth: Payer: Self-pay | Admitting: *Deleted

## 2017-03-11 NOTE — Telephone Encounter (Signed)
CALLED PATIENT TO INFORM OF PET SCAN ON 03-30-17, PT. TO BE NPO- 6 HRS. PRIOR TO TEST, ALTHOUGH PER RADIOLOGY INSTRUCTION SHE CAN HAVE WATER ONLY, TEST TO BE @ [facility] (RADIOLOGYY), PAT. TO ARRIVE @ 5:30 PM FOR 6:00 PM TEST, SPOKE WITH PATIENT AND SHE IS AWARE OF THIS TEST

## 2017-03-15 ENCOUNTER — Telehealth: Payer: Self-pay | Admitting: Radiation Oncology

## 2017-03-15 NOTE — Telephone Encounter (Signed)
Spoke to [employer] regarding Social Work appt.  She will reach out to patient and schedule.

## 2017-03-16 ENCOUNTER — Encounter: Payer: Self-pay | Admitting: *Deleted

## 2017-03-16 ENCOUNTER — Telehealth: Payer: Self-pay | Admitting: *Deleted

## 2017-03-16 NOTE — Telephone Encounter (Signed)
Called patient to inform of Pet being moved from 03-30-17 - arrival time - 5:30 am @ [facility] (radiology), pt. to be NPO- 6 hrs. Prior to test, lvm for a return call

## 2017-03-16 NOTE — Progress Notes (Signed)
[place] spoke with Nicole Huynh she has not received a call from med onc or rad onc from [facility] yet. She does have an appointment time for her PET scan  for 03-30-17. I told her she should receive a call giving her a date and time to start her radiation in [place] per Shona Simpson, PA-C Tonie R.N.

## 2017-03-18 DIAGNOSIS — C210 Malignant neoplasm of anus, unspecified: Secondary | ICD-10-CM

## 2017-04-20 DIAGNOSIS — C210 Malignant neoplasm of anus, unspecified: Secondary | ICD-10-CM

## 2017-04-20 DIAGNOSIS — R232 Flushing: Secondary | ICD-10-CM

## 2017-08-24 DIAGNOSIS — R600 Localized edema: Secondary | ICD-10-CM

## 2017-08-24 DIAGNOSIS — C210 Malignant neoplasm of anus, unspecified: Secondary | ICD-10-CM

## 2017-08-24 DIAGNOSIS — Z923 Personal history of irradiation: Secondary | ICD-10-CM

## 2017-08-24 DIAGNOSIS — F418 Other specified anxiety disorders: Secondary | ICD-10-CM

## 2017-08-24 DIAGNOSIS — D6481 Anemia due to antineoplastic chemotherapy: Secondary | ICD-10-CM

## 2017-08-24 DIAGNOSIS — D72819 Decreased white blood cell count, unspecified: Secondary | ICD-10-CM

## 2017-11-25 IMAGING — CT CT CHEST W/ CM
2 of 5 series · 12 of 36 positions shown, 15 images · IV contrast (ISOVUE 300)
Comparison: None.

CLINICAL DATA: Cancer of biliary canals.   Diagnosed in [REDACTED].

EXAM:
CT CHEST, ABDOMEN, AND PELVIS WITH CONTRAST
TECHNIQUE: Multidetector CT imaging of the chest, abdomen and pelvis was
performed following the standard protocol during bolus
administration of intravenous contrast.
CONTRAST:  100 cc of Ksovue-C33

[Series 2: cap with · axial · 0.74mm/px · z∈[-623,-123]mm · 9 of 126 slices shown, 12 images]
[im 13/126  mediastinal]
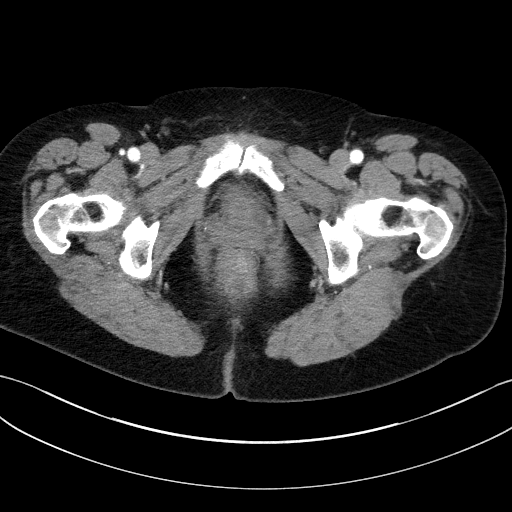
[im 13/126  lung]
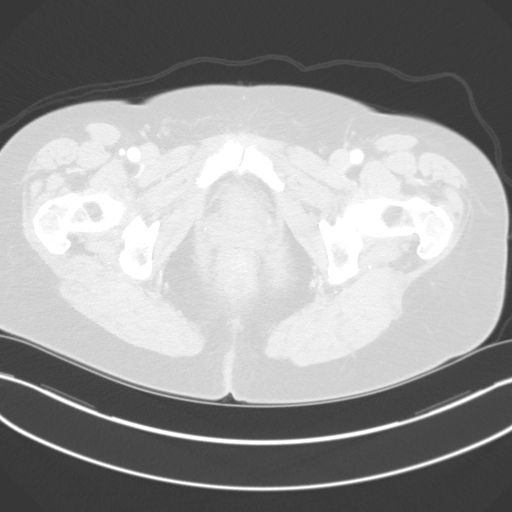
[im 26/126  lung]
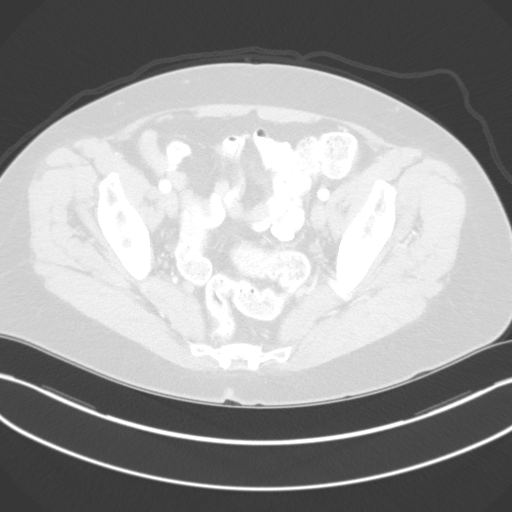
[im 38/126  lung]
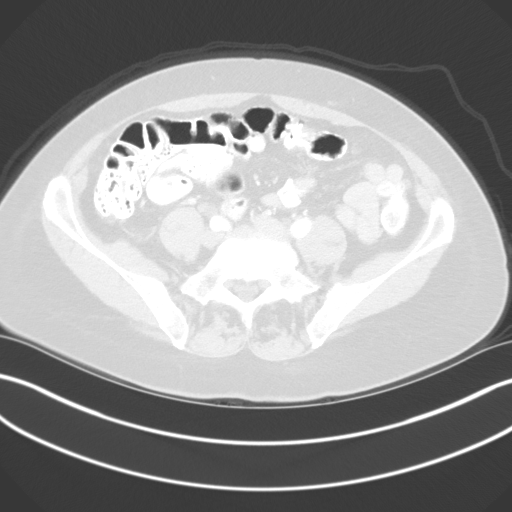
[im 51/126  lung]
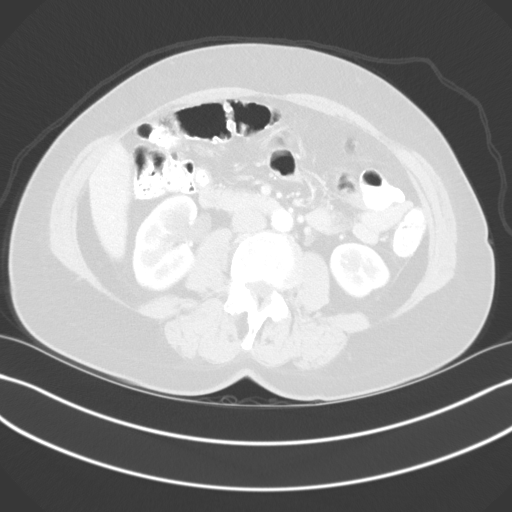
[im 63/126  mediastinal]
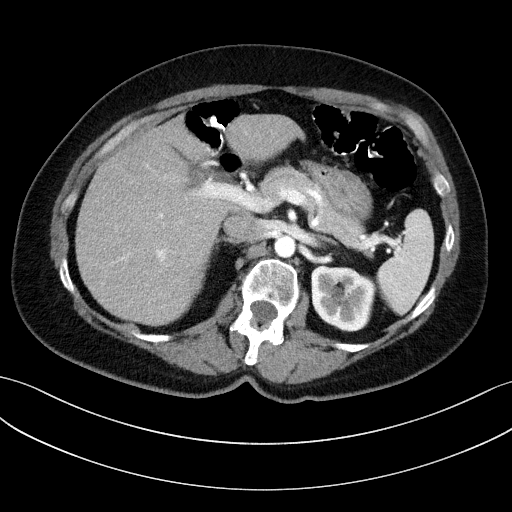
[im 63/126  lung]
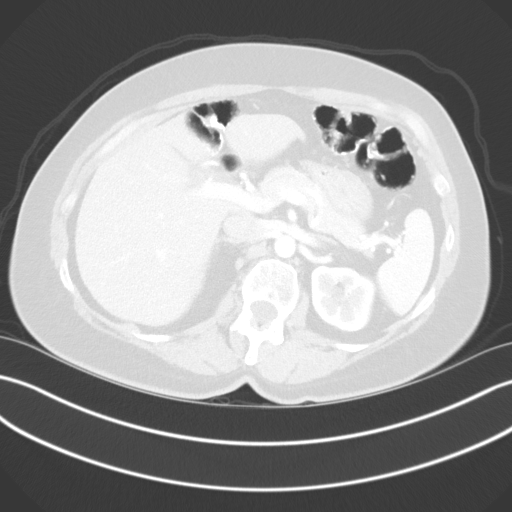
[im 76/126  lung]
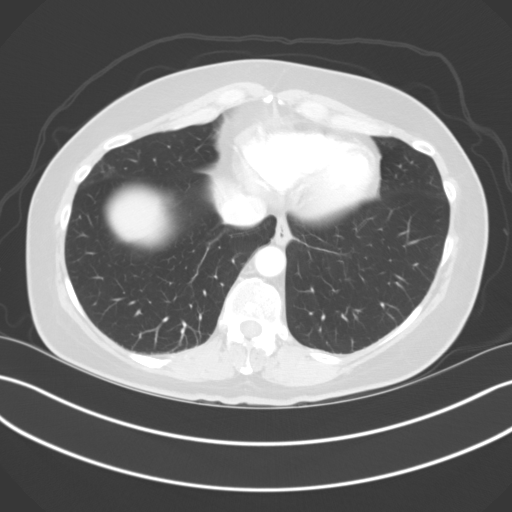
[im 88/126  lung]
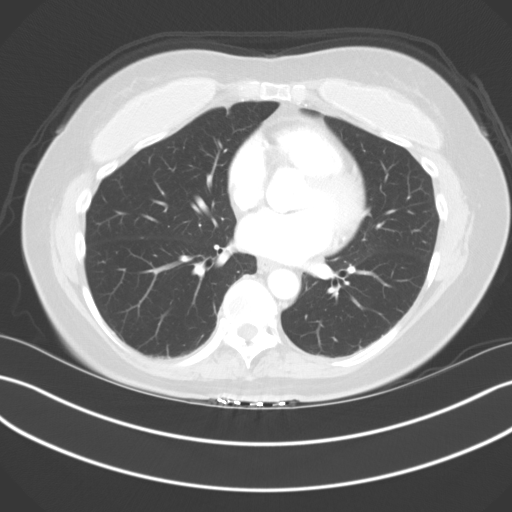
[im 101/126  lung]
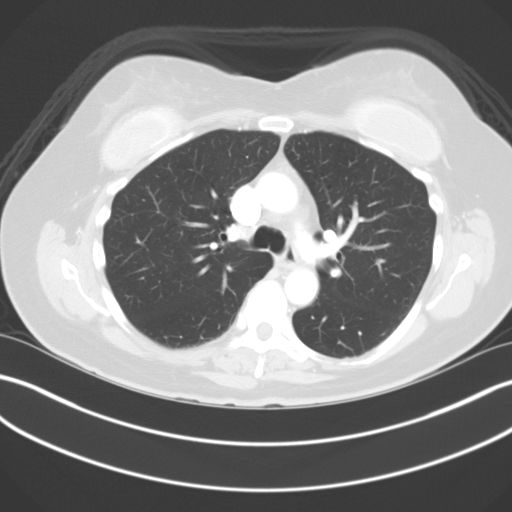
[im 113/126  mediastinal]
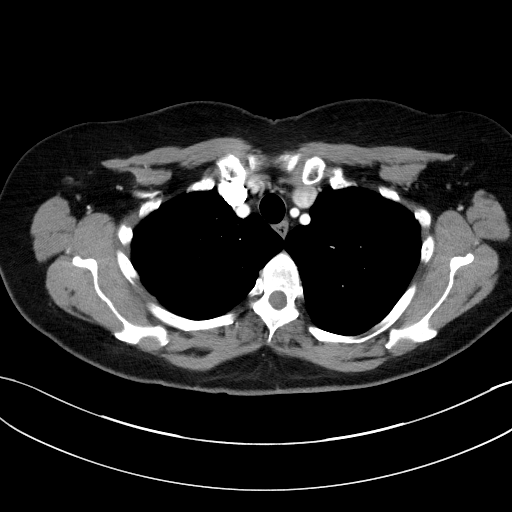
[im 113/126  lung]
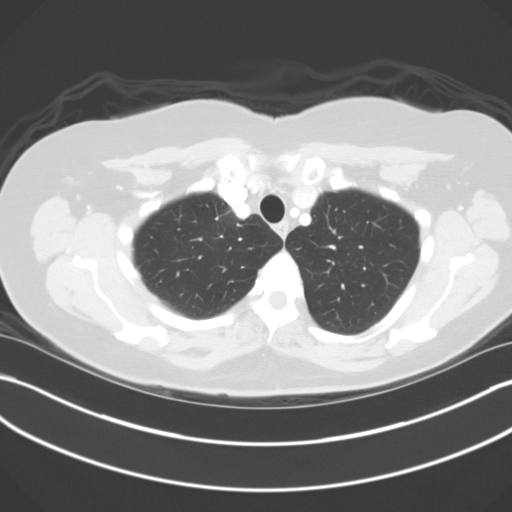

[Series 4: coronals · coronal · 0.84mm/px · 3 of 121 slices shown]
[im 25/121  lung]
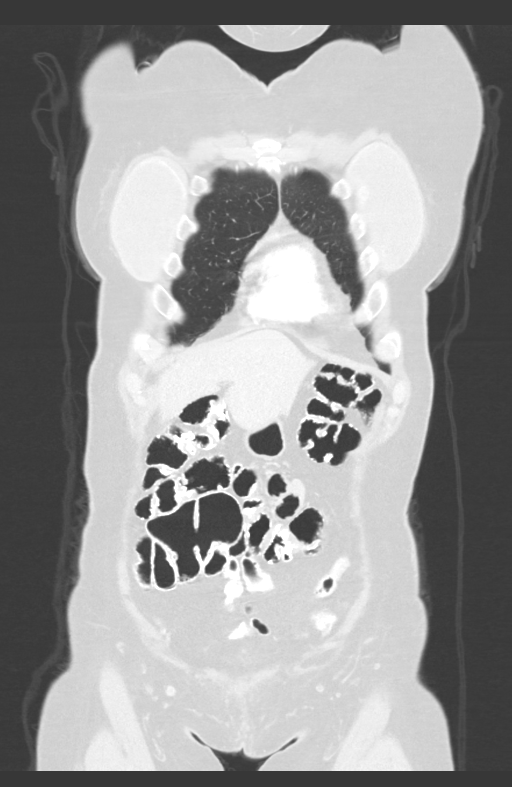
[im 49/121  lung]
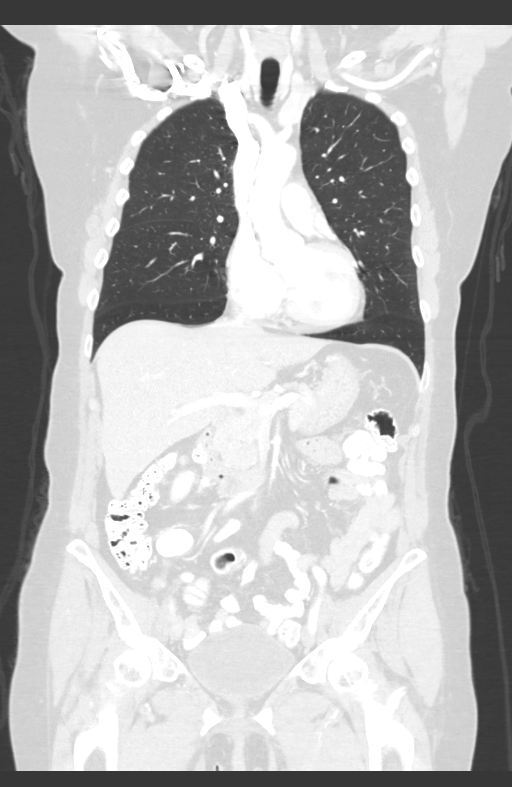
[im 73/121  lung]
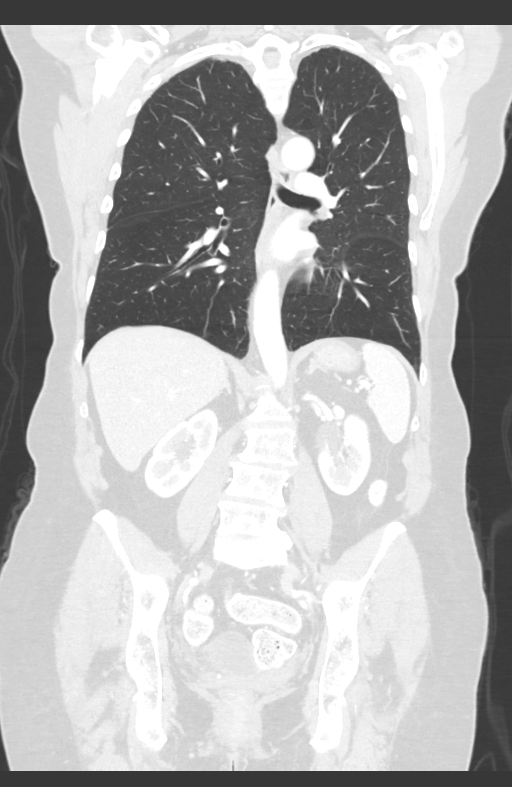

[12 of 36 positions shown; findings below may reference images not displayed]

FINDINGS: CT CHEST FINDINGS

Cardiovascular: Bovine arch. Aortic atherosclerosis. Normal heart
size, without pericardial effusion. No central pulmonary embolism,
on this non-dedicated study.

Mediastinum/Nodes: No supraclavicular adenopathy. 9 mm nonspecific
right-sided thyroid hypoattenuating nodule. No mediastinal or hilar
adenopathy.

Lungs/Pleura: No pleural fluid.  Clear lungs.

Musculoskeletal: Bilateral breast implants.

CT ABDOMEN PELVIS FINDINGS

Hepatobiliary: Hypoattenuating liver lesions. The larger lesions,
including at 7 mm within the central left hepatic lobe are
well-circumscribed and likely cysts. Other lesions are too small to
characterize. No dominant or highly suspicious liver lesion. Normal
gallbladder, without biliary ductal dilatation.

Pancreas: Normal, without mass or ductal dilatation.

Spleen: Normal in size, without focal abnormality.

Adrenals/Urinary Tract: Normal adrenal glands. Normal left kidney.
Right renal cysts and too small to characterize lesions. No
hydronephrosis. Normal urinary bladder.

Stomach/Bowel: Normal stomach, without wall thickening. Normal colon
and terminal ileum. Normal small bowel.

Vascular/Lymphatic: Normal caliber of the aorta and branch vessels.
No abdominopelvic adenopathy.

Reproductive: Hysterectomy.  No adnexal mass.

Other: No significant free fluid. No evidence of omental or
peritoneal disease.

Musculoskeletal: S-shaped thoracolumbar spine curvature.
IMPRESSION: 1. No acute process or evidence of metastatic disease within the
chest, abdomen, or pelvis.
2.  Aortic Atherosclerosis (Z115Y-IU7.7).

## 2018-01-19 DIAGNOSIS — Z85048 Personal history of other malignant neoplasm of rectum, rectosigmoid junction, and anus: Secondary | ICD-10-CM

## 2018-01-19 DIAGNOSIS — Z923 Personal history of irradiation: Secondary | ICD-10-CM

## 2018-01-19 DIAGNOSIS — Z9221 Personal history of antineoplastic chemotherapy: Secondary | ICD-10-CM

## 2018-04-18 DIAGNOSIS — Z90710 Acquired absence of both cervix and uterus: Secondary | ICD-10-CM | POA: Insufficient documentation

## 2018-09-06 DIAGNOSIS — Z85048 Personal history of other malignant neoplasm of rectum, rectosigmoid junction, and anus: Secondary | ICD-10-CM

## 2019-03-07 DIAGNOSIS — Z85048 Personal history of other malignant neoplasm of rectum, rectosigmoid junction, and anus: Secondary | ICD-10-CM | POA: Insufficient documentation

## 2020-01-18 ENCOUNTER — Telehealth: Payer: Self-pay

## 2020-01-18 NOTE — Telephone Encounter (Signed)
..  NOTES ON FILE FROM [facility] [phone], SENT REFERRAL TO SCHEDULING

## 2020-04-10 NOTE — Progress Notes (Incomplete)
[facility]  [address] [place],  [place]  [address] [phone]  Clinic Day:  04/10/2020  Referring physician: Elisabeth Cara, MD   This document serves as a record of services personally performed by Hosie Poisson, MD. It was created on their behalf by [facility] E, a trained medical scribe. The creation of this record is based on the scribe's personal observations and the provider's statements to them.   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  She was originally diagnosed with stage I (T1 N0 M0) squamous cell carcinoma of the anus in May 2018 by Dr. Nehemiah Settle after she had a colonoscopy for rectal bleeding.  CT abdomen and pelvis in July 2018 did not reveal metastasis or adenopathy.  There were hypoattenuating liver lesions, which were well circumscribed and more consistent with cysts.  She underwent surgical resection in July with an exam under anesthesia and excisional biopsy of the malignancy.  The lesion was confined to the mucosa and measured 1.5 cm.  Unfortunately, when she was seen for checkup in October 2018, there was a palpable mass in the left posterior midline distal rectum measuring about 2 cm.  She underwent transanal excisionally biopsy in November.  Pathology revealed an moderately differentiated invasive squamous cell carcinoma measuring 2.3 cm with a positive margin.  There were basaloid features and the lesion involves the submucosa, muscularis propria and perirectal adipose tissue.  PET in December 2018 revealed increased uptake in the left posterior anorectal junction without evidence of metastatic disease.  She received concurrent radiation/chemotherapy with mitomycin-C and infusional 5 fluorouracil completed in March 2019.     She is here for routine follow-up and denies complaints.  She continues to have rare tiny amount of blood from her rectum.  She denies melena or hematochezia.  She denies rectal pain.  She denies nausea,  vomiting, diarrhea, constipation or abdominal pain.  She denies fevers or chills.  She denies sore throat, cough, dyspnea or chest pain.  She denies any urinary symptoms.  She states her appetite is good, but she has been trying to lose weight with diet and exercise.  She continues to follow with Dr. Marcello Moores and had anoscopy in November.  She states there was scar tissue, but no evidence of tumor.  She states she continues to see Dr. Marcello Moores every 4 months.  There was a polyp on her colonoscopy in 2018, so she is not sure if she is due for repeat colonoscopy this year.  INTERVAL HISTORY:  Nicole Huynh is here for routine follow up ***.   Her  appetite is good, and she has gained/lost _ pounds since her last visit.  She denies fever, chills or other signs of infection.  She denies nausea, vomiting, bowel issues, or abdominal pain.  She denies sore throat, cough, dyspnea, or chest pain.  REVIEW OF SYSTEMS:  Review of Systems - Oncology   VITALS:  There were no vitals taken for this visit.  Wt Readings from Last 3 Encounters:  03/10/17 167 lb 9.6 oz (76 kg)  03/04/17 166 lb (75.3 kg)  10/21/16 166 lb (75.3 kg)    There is no height or weight on file to calculate BMI.  Performance status (ECOG): {CHL ONC [id number]  PHYSICAL EXAM:  Physical Exam  LABS:   CBC Latest Ref Rng & Units 03/04/2017 10/21/2016  Hemoglobin 12.0 - 15.0 g/dL 16.1 09.6  Hematocrit 04.5 - 46.0 % 41.0 -   CMP Latest Ref Rng & Units 03/04/2017 10/04/2016  Glucose 65 - 99 mg/dL 409(W) -  Creatinine 1.19 - 1.00 mg/dL - 1.47  Sodium 829 - 562 mmol/L 141 -  Potassium 3.5 - 5.1 mmol/L 5.7(H) -     No results found for: CEA1 / No results found for: CEA1 No results found for: PSA1 No results found for: ZHY865 No results  found for: CAN125  No results found for: TOTALPROTELP, ALBUMINELP, A1GS, A2GS, BETS, BETA2SER, GAMS, MSPIKE, SPEI No results found for: TIBC, FERRITIN, IRONPCTSAT No results found for: LDH   STUDIES:  No results found.   Allergies: No Known Allergies  Current Medications: Current Outpatient Medications  Medication Sig Dispense Refill  . Cholecalciferol (VITAMIN D-3) 1000 units CAPS Take by mouth daily.    Marland Kitchen lisinopril (PRINIVIL,ZESTRIL) 10 MG tablet Take 10 mg by mouth daily.    . Multiple Vitamin (MULTIVITAMIN) tablet Take 1 tablet by mouth daily.    Marland Kitchen oxyCODONE (OXY IR/ROXICODONE) 5 MG immediate release tablet Take 1 tablet (5 mg total) by mouth every 4 (four) hours as needed. (Patient not taking: Reported on 03/10/2017) 10 tablet 0   No current facility-administered medications for this visit.     ASSESSMENT & PLAN:   Assessment:   1. 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    2. 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  She remains without evidence of disease.    3. Left tonsillar lesion of uncertain etiology.  I will have her see Dr. Marcheta Grammes for further evaluation. 4. Mild hyponatremia, we will continue to monitor this.  Plan: We will see her back in 6 months with CBC and CMP.  The patient understands the plans discussed today and is in agreement with them.  She knows to contact our office if she develops concerns prior to her next appointment.   I provided *** minutes (9:19 AM - 9:19 AM) of face-to-face time during this this encounter and > 50% was spent counseling as documented under my assessment and plan.    Dellia Beckwith, MD [facility] [address] [place] [address] Dept: [phone] Dept Fax: [phone]   I, Foye Deer, am acting as scribe for Dellia Beckwith, MD  I have reviewed this report as typed by the medical scribe, and it is complete and accurate.

## 2020-04-14 ENCOUNTER — Inpatient Hospital Stay: Payer: Self-pay | Admitting: Oncology

## 2020-04-14 ENCOUNTER — Inpatient Hospital Stay: Payer: Self-pay

## 2020-12-16 ENCOUNTER — Telehealth: Payer: Self-pay | Admitting: Oncology

## 2020-12-16 NOTE — Telephone Encounter (Signed)
Patient called to scheduled Appt after cancelling due to COVID last year.  Appt made 11/2 Labs 11:00 am - Follow Up 11:30 am

## 2021-01-29 NOTE — Progress Notes (Signed)
Nicole Huynh  [address] [place],  [place]  [address] [phone]  Clinic Day:  02/04/2021  Referring physician: Elisabeth Cara, MD  This document serves as a record of services personally performed by Nicole Poisson, MD. It was created on their behalf by Nicole Huynh, a trained medical scribe. The creation of this record is based on the scribe's personal observations and the provider's statements to them.  ASSESSMENT & PLAN:   1. 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    2. 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  She remains without evidence of disease.     As she continues to do well, we will see her back in 1 year with CBC and CMP.  The patient understands the plans discussed today and is in agreement with them.  She knows to contact our office if she develops concerns prior to her next appointment.   I provided 15 minutes of face-to-face time during this this encounter and > 50% was spent counseling as documented under my assessment and plan.    Nicole Kaplan, MD [facility] [address] [place] [address] Dept: [phone] Dept Fax: [phone]    CHIEF COMPLAINT:  CC: History of recurrent squamous cell carcinoma of the anus  Current Treatment:  Surveillance   HISTORY OF PRESENT ILLNESS:  Nicole Huynh is a 65 y.o. 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  She was originally diagnosed with stage I (T1 N0 M0) squamous cell carcinoma of the anus in May 2018 by Dr. Nehemiah Huynh after she had a colonoscopy for rectal bleeding.  CT abdomen and pelvis in July 2018 did not reveal metastasis or adenopathy.  There were hypoattenuating liver lesions, which were well  circumscribed and more consistent with cysts.  She underwent surgical resection in July with an exam under anesthesia and excisional biopsy of the malignancy.  The lesion was confined to the mucosa and measured 1.5 cm.  Unfortunately, when she was seen for checkup in October 2018, there was a palpable mass in the left posterior midline distal rectum measuring about 2 cm.  She underwent transanal excisionally biopsy in November.  Pathology revealed an moderately differentiated invasive squamous cell carcinoma measuring 2.3 cm with a positive margin.  There were basaloid features and the lesion involves the submucosa, muscularis propria and perirectal adipose tissue.  PET in December 2018 revealed increased uptake in the left posterior anorectal junction without evidence of metastatic disease.  She received concurrent radiation/chemotherapy with mitomycin-C and infusional 5 fluorouracil completed in March 2019.    INTERVAL HISTORY:  I have reviewed her chart and materials related to her cancer extensively and collaborated history with the patient. Summary of oncologic history is as follows: Oncology History   No history exists.    Nicole Huynh is here for annual follow up and states that she has been well. She continues to follow up with Nicole Huynh for anoscopy and saw her on October 24th with a good report. She will see her again in 6 months. She is up to date on colonoscopy with Nicole Huynh as of August 2021, and will repeat this again in 3 years. Blood counts and chemistries are unremarkable. Her  appetite is good, and she has gained 11 and 1/2 pounds since  her last visit.  She denies fever, chills or other signs of infection.  She denies nausea, vomiting, bowel issues, or abdominal pain.  She denies sore throat, cough, dyspnea, or chest pain.  HISTORY:   Current Medications: Current Outpatient Medications  Medication Sig Dispense Refill   Cholecalciferol (VITAMIN D-3) 1000 units CAPS Take by mouth daily.      lisinopril (PRINIVIL,ZESTRIL) 10 MG tablet Take 10 mg by mouth daily.     Multiple Vitamin (MULTIVITAMIN) tablet Take 1 tablet by mouth daily.     oxyCODONE (OXY IR/ROXICODONE) 5 MG immediate release tablet Take 1 tablet (5 mg total) by mouth every 4 (four) hours as needed. (Patient not taking: Reported on 03/10/2017) 10 tablet 0   No current facility-administered medications for this visit.    REVIEW OF SYSTEMS:  Review of Systems  Constitutional: Negative.  Negative for appetite change, chills, fatigue, fever and unexpected weight change.  HENT:  Negative.    Eyes: Negative.   Respiratory: Negative.  Negative for chest tightness, cough, hemoptysis, shortness of breath and wheezing.   Cardiovascular: Negative.  Negative for chest pain, leg swelling and palpitations.  Gastrointestinal: Negative.  Negative for abdominal distention, abdominal pain, blood in stool, constipation, diarrhea, nausea and vomiting.  Endocrine: Negative.   Genitourinary: Negative.  Negative for difficulty urinating, dysuria, frequency and hematuria.   Musculoskeletal:  Positive for arthralgias (of the right hip). Negative for back pain, flank pain, gait problem and myalgias.  Skin: Negative.   Neurological: Negative.  Negative for dizziness, extremity weakness, gait problem, headaches, light-headedness, numbness, seizures and speech difficulty.  Hematological: Negative.   Psychiatric/Behavioral: Negative.  Negative for depression and sleep disturbance. The patient is not nervous/anxious.      VITALS:  Blood pressure (!) 162/77, pulse 77, temperature 98.6 F (37 C), temperature source Oral, resp. rate 18, height 5\' 7"  (1.702 m), weight 158 lb 3.2 oz (71.8 kg), SpO2 96 %.  Wt Readings from Last 3 Encounters:  02/04/21 158 lb 3.2 oz (71.8 kg)  03/10/17 167 lb 9.6 oz (76 kg)  03/04/17 166 lb (75.3 kg)    Body mass index is 24.78 kg/m.  Performance status (ECOG): 1 - Symptomatic but completely  ambulatory  PHYSICAL EXAM:  Physical Exam Constitutional:      General: She is not in acute distress.    Appearance: Normal appearance. She is normal weight.  HENT:     Head: Normocephalic and atraumatic.  Eyes:     General: No scleral icterus.    Extraocular Movements: Extraocular movements intact.     Conjunctiva/sclera: Conjunctivae normal.     Pupils: Pupils are equal, round, and reactive to light.  Cardiovascular:     Rate and Rhythm: Normal rate and regular rhythm.     Pulses: Normal pulses.     Heart sounds: Normal heart sounds. No murmur heard.   No friction rub. No gallop.  Pulmonary:     Effort: Pulmonary effort is normal. No respiratory distress.     Breath sounds: Normal breath sounds.  Abdominal:     General: Bowel sounds are normal. There is no distension.     Palpations: Abdomen is soft. There is no hepatomegaly, splenomegaly or mass.     Tenderness: There is no abdominal tenderness.  Musculoskeletal:        General: Normal range of motion.     Cervical back: Normal range of motion and neck supple.     Right lower leg: No edema.  Left lower leg: No edema.  Lymphadenopathy:     Cervical: No cervical adenopathy.  Skin:    General: Skin is warm and dry.  Neurological:     General: No focal deficit present.     Mental Status: She is alert and oriented to person, place, and time. Mental status is at baseline.  Psychiatric:        Mood and Affect: Mood normal.        Behavior: Behavior normal.        Thought Content: Thought content normal.        Judgment: Judgment normal.     LABS:   CBC Latest Ref Rng & Units 03/04/2017 10/21/2016  Hemoglobin 12.0 - 15.0 g/dL 13.9 13.9  Hematocrit 36.0 - 46.0 % 41.0 -   CMP Latest Ref Rng & Units 03/04/2017 10/04/2016  Glucose 65 - 99 mg/dL 106(H) -  Creatinine 0.44 - 1.00 mg/dL - 0.80  Sodium 135 - 145 mmol/L 141 -  Potassium 3.5 - 5.1 mmol/L 5.7(H) -     STUDIES:  No results found.    I, Rita Ohara, am  acting as scribe for Nicole Kaplan, MD  I have reviewed this report as typed by the medical scribe, and it is complete and accurate.

## 2021-02-03 ENCOUNTER — Other Ambulatory Visit: Payer: Self-pay | Admitting: Oncology

## 2021-02-03 DIAGNOSIS — C210 Malignant neoplasm of anus, unspecified: Secondary | ICD-10-CM

## 2021-02-04 ENCOUNTER — Encounter: Payer: Self-pay | Admitting: Oncology

## 2021-02-04 ENCOUNTER — Inpatient Hospital Stay: Payer: Medicare Other | Attending: Oncology

## 2021-02-04 ENCOUNTER — Inpatient Hospital Stay (INDEPENDENT_AMBULATORY_CARE_PROVIDER_SITE_OTHER): Payer: Medicare Other | Admitting: Oncology

## 2021-02-04 ENCOUNTER — Telehealth: Payer: Self-pay | Admitting: Oncology

## 2021-02-04 VITALS — BP 162/77 | HR 77 | Temp 98.6°F | Resp 18 | Ht 67.0 in | Wt 158.2 lb

## 2021-02-04 DIAGNOSIS — C210 Malignant neoplasm of anus, unspecified: Secondary | ICD-10-CM

## 2021-02-04 LAB — BASIC METABOLIC PANEL
BUN: 15 (ref 4–21)
CO2: 25 — AB (ref 13–22)
Chloride: 103 (ref 99–108)
Creatinine: 0.8 (ref 0.5–1.1)
Glucose: 83
Potassium: 4.5 (ref 3.4–5.3)
Sodium: 136 — AB (ref 137–147)

## 2021-02-04 LAB — COMPREHENSIVE METABOLIC PANEL
Albumin: 4.2 (ref 3.5–5.0)
Calcium: 9.1 (ref 8.7–10.7)

## 2021-02-04 LAB — HEPATIC FUNCTION PANEL
ALT: 14 (ref 7–35)
AST: 22 (ref 13–35)
Alkaline Phosphatase: 56 (ref 25–125)
Bilirubin, Total: 0.7

## 2021-02-04 LAB — CBC AND DIFFERENTIAL
HCT: 38 (ref 36–46)
Hemoglobin: 12.9 (ref 12.0–16.0)
Neutrophils Absolute: 4.86
Platelets: 166 (ref 150–399)
WBC: 6

## 2021-02-04 LAB — CBC: RBC: 4.05 (ref 3.87–5.11)

## 2021-02-04 NOTE — Telephone Encounter (Signed)
Per 11/2 los spoke with patient and confirmed next appt

## 2022-01-20 ENCOUNTER — Telehealth: Payer: Self-pay | Admitting: Oncology

## 2022-01-20 NOTE — Telephone Encounter (Signed)
Contacted pt to R/S appt on 11/3 to a different day due to Dr. Hinton Rao having a meeting. She requested to call us back at a different time to reschedule, appt has been cancelled for now.

## 2022-02-05 ENCOUNTER — Other Ambulatory Visit: Payer: Medicare Other

## 2022-02-05 ENCOUNTER — Ambulatory Visit: Payer: Medicare Other | Admitting: Oncology

## 2023-01-03 ENCOUNTER — Ambulatory Visit: Payer: Self-pay | Admitting: General Surgery

## 2023-01-03 NOTE — H&P (View-Only) (Signed)
PROVIDER:  Elenora Gamma, MD  MRN: [id number] DOB: April 07, 1955 DATE OF ENCOUNTER: 01/03/2023 Subjective  Chief Complaint: LONG TERM FOLLOW UP ( Anal cancer)     History of Present Illness: Nicole Huynh is a 67 y.o. female who is seen today for anal cancer f/u.  Patient was taken to the operating room on October 21, 2016 for anal ultrasound and resection.  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  She underwent a 55-month follow-up exam and there was recurrent mass at the same spot.  Biopsy showed squamous cell carcinoma.  She was referred to [facility] for chemoradiation and treatment.  She completed this in March 2019.  She has been undergoing surveillance since then.  I last saw her in Oct 2022.  She missed her final appointment.      Objective:   Vitals:  01/03/23 1139 BP: (!) 160/80 Pulse: 88 Temp: 37.3 C (99.2 F) SpO2: 99% Weight: 65 kg (143 lb 6.4 oz) Height: 170.2 cm (5\' 7" ) PainSc: 0-No pain     General appearance - alert, well appearing, and in no distress Lymphatics - no inguinal lymphadenopathy palpated Rectal - L posterior distal rectal scar palpated   Labs, Imaging and Diagnostic Testing:   Procedure: Anoscopy Surgeon: Maisie Fus After the risks and benefits were explained, written consent was obtained for above procedure.  A medical assistant chaperone was present thoroughout the entire procedure.  Anesthesia: none Diagnosis: h/o anal SCC Findings: Left posterior scar noted in distal rectum.  No signs of recurrence.  Patient has a significantly inflamed grade 3 right anterior hemorrhoid   Assessment and Plan: Diagnoses and all orders for this visit:  History of anal cancer  Prolapsed internal hemorrhoids, grade 3   Patient seems to be doing well.  There are no signs of  recurrence.  She has done with her anal cancer surveillance but she does have a grade 3 hemorrhoid with significant inflammation.  I have recommended excision of this to send for biopsy.  Risk include bleeding pain and recurrence.  All questions were answered.  Patient agrees to proceed.   [name], MD Colon and Rectal Surgery [facility]     [name], MD Colon and Rectal Surgery [facility] Surgery

## 2023-01-03 NOTE — H&P (Signed)
PROVIDER:  Elenora Gamma, MD  MRN: [id number] DOB: April 07, 1955 DATE OF ENCOUNTER: 01/03/2023 Subjective  Chief Complaint: LONG TERM FOLLOW UP ( Anal cancer)     History of Present Illness: Nicole Huynh is a 67 y.o. female who is seen today for anal cancer f/u.  Patient was taken to the operating room on October 21, 2016 for anal ultrasound and resection.  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  She underwent a 55-month follow-up exam and there was recurrent mass at the same spot.  Biopsy showed squamous cell carcinoma.  She was referred to [facility] center for chemoradiation and treatment.  She completed this in March 2019.  She has been undergoing surveillance since then.  I last saw her in Oct 2022.  She missed her final appointment.      Objective:   Vitals:  01/03/23 1139 BP: (!) 160/80 Pulse: 88 Temp: 37.3 C (99.2 F) SpO2: 99% Weight: 65 kg (143 lb 6.4 oz) Height: 170.2 cm (5\' 7" ) PainSc: 0-No pain     General appearance - alert, well appearing, and in no distress Lymphatics - no inguinal lymphadenopathy palpated Rectal - L posterior distal rectal scar palpated   Labs, Imaging and Diagnostic Testing:   Procedure: Anoscopy Surgeon: Maisie Fus After the risks and benefits were explained, written consent was obtained for above procedure.  A medical assistant chaperone was present thoroughout the entire procedure.  Anesthesia: none Diagnosis: h/o anal SCC Findings: Left posterior scar noted in distal rectum.  No signs of recurrence.  Patient has a significantly inflamed grade 3 right anterior hemorrhoid   Assessment and Plan: Diagnoses and all orders for this visit:  History of anal cancer  Prolapsed internal hemorrhoids, grade 3   Patient seems to be doing well.  There are no signs of  recurrence.  She has done with her anal cancer surveillance but she does have a grade 3 hemorrhoid with significant inflammation.  I have recommended excision of this to send for biopsy.  Risk include bleeding pain and recurrence.  All questions were answered.  Patient agrees to proceed.   [name], MD Colon and Rectal Surgery [facility] Surgery     [name], MD Colon and Rectal Surgery [facility] Surgery

## 2023-01-06 ENCOUNTER — Encounter (HOSPITAL_BASED_OUTPATIENT_CLINIC_OR_DEPARTMENT_OTHER): Payer: Self-pay | Admitting: General Surgery

## 2023-01-06 ENCOUNTER — Other Ambulatory Visit: Payer: Self-pay

## 2023-01-06 NOTE — Progress Notes (Signed)
Spoke w/ via phone for pre-op interview---pt Lab needs dos---- I stat, ekg        Lab results------ COVID test -----patient states asymptomatic no test needed Arrive at -------1300 01-12-2023 NPO after MN NO Solid Food.  Clear liquids from MN until---1200 Med rec completed Medications to take morning of surgery -----none Diabetic medication -----n/a Patient instructed no nail polish to be worn day of surgery Patient instructed to bring photo id and insurance card day of surgery Patient aware to have Driver (ride ) / caregiver    for 24 hours after surgery  husband billy-  Patient Special Instructions -----none Pre-Op special Instructions -----none Patient verbalized understanding of instructions that were given at this phone interview. Patient denies chest pain, sob, fever, cough at the interview.

## 2023-01-12 ENCOUNTER — Ambulatory Visit (HOSPITAL_BASED_OUTPATIENT_CLINIC_OR_DEPARTMENT_OTHER): Payer: Self-pay | Admitting: Anesthesiology

## 2023-01-12 ENCOUNTER — Other Ambulatory Visit: Payer: Self-pay

## 2023-01-12 ENCOUNTER — Encounter (HOSPITAL_BASED_OUTPATIENT_CLINIC_OR_DEPARTMENT_OTHER): Payer: Self-pay | Admitting: General Surgery

## 2023-01-12 ENCOUNTER — Ambulatory Visit (HOSPITAL_BASED_OUTPATIENT_CLINIC_OR_DEPARTMENT_OTHER)
Admission: RE | Admit: 2023-01-12 | Discharge: 2023-01-12 | Disposition: A | Discharge status: Home / Self Care | Payer: Medicare Other | Attending: General Surgery | Admitting: General Surgery

## 2023-01-12 ENCOUNTER — Ambulatory Visit (HOSPITAL_BASED_OUTPATIENT_CLINIC_OR_DEPARTMENT_OTHER): Payer: Medicare Other | Admitting: Anesthesiology

## 2023-01-12 ENCOUNTER — Encounter (HOSPITAL_BASED_OUTPATIENT_CLINIC_OR_DEPARTMENT_OTHER)
Admission: RE | Disposition: A | Discharge status: Home / Self Care | Payer: Self-pay | Source: Home / Self Care | Attending: General Surgery

## 2023-01-12 DIAGNOSIS — Z85048 Personal history of other malignant neoplasm of rectum, rectosigmoid junction, and anus: Secondary | ICD-10-CM | POA: Insufficient documentation

## 2023-01-12 DIAGNOSIS — I10 Essential (primary) hypertension: Secondary | ICD-10-CM | POA: Diagnosis not present

## 2023-01-12 DIAGNOSIS — K624 Stenosis of anus and rectum: Secondary | ICD-10-CM | POA: Insufficient documentation

## 2023-01-12 DIAGNOSIS — Z01818 Encounter for other preprocedural examination: Secondary | ICD-10-CM

## 2023-01-12 DIAGNOSIS — K642 Third degree hemorrhoids: Secondary | ICD-10-CM

## 2023-01-12 HISTORY — DX: Malignant neoplasm of anus, unspecified: C21.0

## 2023-01-12 HISTORY — DX: Unspecified osteoarthritis, unspecified site: M19.90

## 2023-01-12 HISTORY — PX: HEMORRHOID SURGERY: SHX153

## 2023-01-12 HISTORY — DX: Unspecified hemorrhoids: K64.9

## 2023-01-12 LAB — POCT I-STAT, CHEM 8
BUN: 19 mg/dL (ref 8–23)
Calcium, Ion: 1.24 mmol/L (ref 1.15–1.40)
Chloride: 105 mmol/L (ref 98–111)
Creatinine, Ser: 0.8 mg/dL (ref 0.44–1.00)
Glucose, Bld: 86 mg/dL (ref 70–99)
HCT: 42 % (ref 36.0–46.0)
Hemoglobin: 14.3 g/dL (ref 12.0–15.0)
Potassium: 3.9 mmol/L (ref 3.5–5.1)
Sodium: 139 mmol/L (ref 135–145)
TCO2: 23 mmol/L (ref 22–32)

## 2023-01-12 SURGERY — HEMORRHOIDECTOMY
Anesthesia: Monitor Anesthesia Care | Site: Rectum

## 2023-01-12 MED ORDER — FENTANYL CITRATE (PF) 100 MCG/2ML IJ SOLN: 25.0000 ug | INTRAMUSCULAR | Status: DC | PRN

## 2023-01-12 MED ORDER — LIDOCAINE 2% (20 MG/ML) 5 ML SYRINGE
INTRAMUSCULAR | Status: DC | PRN
  Administered 2023-01-12: 80 mg via INTRAVENOUS

## 2023-01-12 MED ORDER — PROPOFOL 10 MG/ML IV BOLUS
INTRAVENOUS | Status: DC | PRN
  Administered 2023-01-12: 20 mg via INTRAVENOUS
  Administered 2023-01-12: 10 mg via INTRAVENOUS

## 2023-01-12 MED ORDER — CELECOXIB 200 MG PO CAPS: 200.0000 mg | ORAL_CAPSULE | ORAL | Status: DC

## 2023-01-12 MED ORDER — BUPIVACAINE-EPINEPHRINE (PF) 0.5% -1:200000 IJ SOLN
INTRAMUSCULAR | Status: DC | PRN
  Administered 2023-01-12: 50 mL via SURGICAL_CAVITY

## 2023-01-12 MED ORDER — PHENYLEPHRINE 80 MCG/ML (10ML) SYRINGE FOR IV PUSH (FOR BLOOD PRESSURE SUPPORT)
PREFILLED_SYRINGE | INTRAVENOUS | Status: DC | PRN
Start: 2023-01-12 — End: 2023-01-12
  Administered 2023-01-12 (×2): 80 ug via INTRAVENOUS

## 2023-01-12 MED ORDER — ONDANSETRON HCL 4 MG/2ML IJ SOLN
INTRAMUSCULAR | Status: DC | PRN
  Administered 2023-01-12: 4 mg via INTRAVENOUS

## 2023-01-12 MED ORDER — 0.9 % SODIUM CHLORIDE (POUR BTL) OPTIME
TOPICAL | Status: DC | PRN
Start: 2023-01-12 — End: 2023-01-12
  Administered 2023-01-12: 500 mL

## 2023-01-12 MED ORDER — LACTATED RINGERS IV SOLN: INTRAVENOUS | Status: DC

## 2023-01-12 MED ORDER — SODIUM CHLORIDE 0.9% FLUSH: 3.0000 mL | Freq: Two times a day (BID) | INTRAVENOUS | Status: DC

## 2023-01-12 MED ORDER — OXYCODONE HCL 5 MG/5ML PO SOLN: 5.0000 mg | Freq: Once | ORAL | Status: DC | PRN

## 2023-01-12 MED ORDER — ACETAMINOPHEN 500 MG PO TABS
1000.0000 mg | ORAL_TABLET | ORAL | Status: AC
  Administered 2023-01-12: 1000 mg via ORAL

## 2023-01-12 MED ORDER — PROPOFOL 500 MG/50ML IV EMUL
INTRAVENOUS | Status: DC | PRN
  Administered 2023-01-12: 125 ug/kg/min via INTRAVENOUS

## 2023-01-12 MED ORDER — FENTANYL CITRATE (PF) 100 MCG/2ML IJ SOLN
INTRAMUSCULAR | Status: DC | PRN
  Administered 2023-01-12: 50 ug via INTRAVENOUS

## 2023-01-12 MED ORDER — CELECOXIB 200 MG PO CAPS
ORAL_CAPSULE | ORAL | Status: AC
  Filled 2023-01-12: qty 1

## 2023-01-12 MED ORDER — GABAPENTIN 300 MG PO CAPS
ORAL_CAPSULE | ORAL | Status: AC
  Filled 2023-01-12: qty 1

## 2023-01-12 MED ORDER — GABAPENTIN 300 MG PO CAPS
300.0000 mg | ORAL_CAPSULE | ORAL | Status: AC
  Administered 2023-01-12: 300 mg via ORAL

## 2023-01-12 MED ORDER — GLYCOPYRROLATE PF 0.2 MG/ML IJ SOSY
PREFILLED_SYRINGE | INTRAMUSCULAR | Status: DC | PRN
Start: 2023-01-12 — End: 2023-01-12
  Administered 2023-01-12 (×2): .1 mg via INTRAVENOUS

## 2023-01-12 MED ORDER — PROPOFOL 10 MG/ML IV BOLUS
INTRAVENOUS | Status: AC
  Filled 2023-01-12: qty 20

## 2023-01-12 MED ORDER — BUPIVACAINE LIPOSOME 1.3 % IJ SUSP: 20.0000 mL | Freq: Once | INTRAMUSCULAR | Status: DC

## 2023-01-12 MED ORDER — ACETAMINOPHEN 500 MG PO TABS
ORAL_TABLET | ORAL | Status: AC
  Filled 2023-01-12: qty 2

## 2023-01-12 MED ORDER — ACETAMINOPHEN 500 MG PO TABS: 1000.0000 mg | ORAL_TABLET | Freq: Once | ORAL | Status: DC

## 2023-01-12 MED ORDER — ONDANSETRON HCL 4 MG/2ML IJ SOLN: 4.0000 mg | Freq: Once | INTRAMUSCULAR | Status: DC | PRN

## 2023-01-12 MED ORDER — LACTATED RINGERS IV SOLN: INTRAVENOUS | Status: DC | PRN

## 2023-01-12 MED ORDER — GABAPENTIN 300 MG PO CAPS: 300.0000 mg | ORAL_CAPSULE | ORAL | Status: DC

## 2023-01-12 MED ORDER — FENTANYL CITRATE (PF) 100 MCG/2ML IJ SOLN
INTRAMUSCULAR | Status: AC
  Filled 2023-01-12: qty 2

## 2023-01-12 MED ORDER — ACETAMINOPHEN 500 MG PO TABS: 1000.0000 mg | ORAL_TABLET | ORAL | Status: DC

## 2023-01-12 MED ORDER — MIDAZOLAM HCL 2 MG/2ML IJ SOLN
INTRAMUSCULAR | Status: AC
  Filled 2023-01-12: qty 2

## 2023-01-12 MED ORDER — MIDAZOLAM HCL 5 MG/5ML IJ SOLN
INTRAMUSCULAR | Status: DC | PRN
  Administered 2023-01-12: 2 mg via INTRAVENOUS

## 2023-01-12 MED ORDER — CELECOXIB 200 MG PO CAPS
200.0000 mg | ORAL_CAPSULE | ORAL | Status: AC
  Administered 2023-01-12: 200 mg via ORAL

## 2023-01-12 MED ORDER — OXYCODONE HCL 5 MG PO TABS: 5.0000 mg | ORAL_TABLET | Freq: Once | ORAL | Status: DC | PRN

## 2023-01-12 MED ORDER — OXYCODONE HCL 5 MG PO TABS
5.0000 mg | ORAL_TABLET | Freq: Four times a day (QID) | ORAL | 0 refills | Status: AC | PRN
Start: 2023-01-12 — End: ?

## 2023-01-12 MED ORDER — PHENYLEPHRINE 80 MCG/ML (10ML) SYRINGE FOR IV PUSH (FOR BLOOD PRESSURE SUPPORT)
PREFILLED_SYRINGE | INTRAVENOUS | Status: AC
  Filled 2023-01-12: qty 10

## 2023-01-12 SURGICAL SUPPLY — 40 items
BLADE EXTENDED COATED 6.5IN (ELECTRODE) IMPLANT
BLADE SURG 10 STRL SS (BLADE) IMPLANT
BRIEF MESH DISP LRG (UNDERPADS AND DIAPERS) IMPLANT
COVER BACK TABLE 60X90IN (DRAPES) ×1 IMPLANT
COVER MAYO STAND STRL (DRAPES) ×1 IMPLANT
DRAPE HYSTEROSCOPY (MISCELLANEOUS) IMPLANT
DRAPE LAPAROTOMY 100X72 PEDS (DRAPES) ×1 IMPLANT
DRAPE SHEET LG 3/4 BI-LAMINATE (DRAPES) IMPLANT
DRAPE UTILITY XL STRL (DRAPES) ×1 IMPLANT
ELECT REM PT RETURN 9FT ADLT (ELECTROSURGICAL) ×1
ELECTRODE REM PT RTRN 9FT ADLT (ELECTROSURGICAL) ×1 IMPLANT
GAUZE 4X4 16PLY ~~LOC~~+RFID DBL (SPONGE) ×1 IMPLANT
GAUZE PAD ABD 8X10 STRL (GAUZE/BANDAGES/DRESSINGS) ×1 IMPLANT
GAUZE SPONGE 4X4 12PLY STRL (GAUZE/BANDAGES/DRESSINGS) IMPLANT
GLOVE BIO SURGEON STRL SZ 6.5 (GLOVE) ×1 IMPLANT
GLOVE INDICATOR 6.5 STRL GRN (GLOVE) ×1 IMPLANT
KIT SIGMOIDOSCOPE (SET/KITS/TRAYS/PACK) IMPLANT
KIT TURNOVER CYSTO (KITS) ×1 IMPLANT
LEGGING LITHOTOMY PAIR STRL (DRAPES) IMPLANT
NDL HYPO 22X1.5 SAFETY MO (MISCELLANEOUS) ×1 IMPLANT
NEEDLE HYPO 22X1.5 SAFETY MO (MISCELLANEOUS) ×1
NS IRRIG 500ML POUR BTL (IV SOLUTION) ×1 IMPLANT
PACK BASIN DAY SURGERY FS (CUSTOM PROCEDURE TRAY) ×1 IMPLANT
PAD ARMBOARD 7.5X6 YLW CONV (MISCELLANEOUS) IMPLANT
PENCIL SMOKE EVACUATOR (MISCELLANEOUS) ×1 IMPLANT
SLEEVE SCD COMPRESS KNEE MED (STOCKING) ×1 IMPLANT
SPONGE HEMORRHOID 8X3CM (HEMOSTASIS) IMPLANT
SPONGE SURGIFOAM ABS GEL 100 (HEMOSTASIS) IMPLANT
SPONGE SURGIFOAM ABS GEL 12-7 (HEMOSTASIS) IMPLANT
SUT CHROMIC 2 0 SH (SUTURE) ×2 IMPLANT
SUT CHROMIC 3 0 SH 27 (SUTURE) ×2 IMPLANT
SUT VIC AB 2-0 SH 27 (SUTURE)
SUT VIC AB 2-0 SH 27XBRD (SUTURE) IMPLANT
SUT VIC AB 4-0 SH 18 (SUTURE) IMPLANT
SYR CONTROL 10ML LL (SYRINGE) ×1 IMPLANT
TOWEL OR 17X24 6PK STRL BLUE (TOWEL DISPOSABLE) ×1 IMPLANT
TRAY DSU PREP LF (CUSTOM PROCEDURE TRAY) ×1 IMPLANT
TUBE CONNECTING 12X1/4 (SUCTIONS) ×1 IMPLANT
WATER STERILE IRR 500ML POUR (IV SOLUTION) IMPLANT
YANKAUER SUCT BULB TIP NO VENT (SUCTIONS) ×1 IMPLANT

## 2023-01-12 NOTE — Interval H&P Note (Signed)
History and Physical Interval Note:  01/12/2023 2:20 PM  Nicole Huynh  has presented today for surgery, with the diagnosis of GRADE 3 HEMORRHOID.  The various methods of treatment have been discussed with the patient and family. After consideration of risks, benefits and other options for treatment, the patient has consented to  Procedure(s): HEMORRHOIDECTOMY (N/A) as a surgical intervention.  The patient's history has been reviewed, patient examined, no change in status, stable for surgery.  I have reviewed the patient's chart and labs.  Questions were answered to the patient's satisfaction.     [name], MD  Colorectal and General Surgery [facility]

## 2023-01-12 NOTE — Anesthesia Procedure Notes (Signed)
Procedure Name: MAC Date/Time: 01/12/2023 2:29 PM  Performed by: Bishop Limbo, CRNAPre-anesthesia Checklist: Patient identified, Emergency Drugs available, Suction available and Patient being monitored Oxygen Delivery Method: Simple face mask Placement Confirmation: positive ETCO2 Dental Injury: Teeth and Oropharynx as per pre-operative assessment

## 2023-01-12 NOTE — Transfer of Care (Signed)
Immediate Anesthesia Transfer of Care Note  Patient: Nicole Huynh  Procedure(s) Performed: SINGLE COLUMN HEMORRHOIDECTOMY (Rectum)  Patient Location: PACU  Anesthesia Type:MAC  Level of Consciousness: awake, alert , oriented, and patient cooperative  Airway & Oxygen Therapy: Patient Spontanous Breathing  Post-op Assessment: Report given to RN and Post -op Vital signs reviewed and stable  Post vital signs: Reviewed and stable  Last Vitals:  Vitals Value Taken Time  BP    Temp    Pulse    Resp    SpO2      Last Pain:  Vitals:   01/12/23 1325  TempSrc: Oral  PainSc: 0-No pain      Patients Stated Pain Goal: 5 (01/12/23 1325)  Complications: No notable events documented.

## 2023-01-12 NOTE — Discharge Instructions (Addendum)
ANORECTAL SURGERY: POST OP INSTRUCTIONS Take your usually prescribed home medications unless otherwise directed. DIET: During the first few hours after surgery sip on some liquids until you are able to urinate.  It is normal to not urinate for several hours after this surgery.  If you feel uncomfortable, please contact the office for instructions.  After you are able to urinate,you may eat, if you feel like it.  Follow a light bland diet the first 24 hours after arrival home, such as soup, liquids, crackers, etc.  Be sure to include lots of fluids daily (6-8 glasses).  Avoid fast food or heavy meals, as your are more likely to get nauseated.  Eat a low fat diet the next few days after surgery.  Limit caffeine intake to 1-2 servings a day. PAIN CONTROL: Pain is best controlled by a usual combination of several different methods TOGETHER: Muscle relaxation: Soak in a warm bath (or Sitz bath) three times a day and after bowel movements.  Continue to do this until all pain is resolved. Over the counter pain medication Prescription pain medication Most patients will experience some swelling and discomfort in the anus/rectal area and incisions.  Heat such as warm towels, sitz baths, warm baths, etc to help relax tight/sore spots and speed recovery.  Some people prefer to use ice, especially in the first couple days after surgery, as it may decrease the pain and swelling, or alternate between ice & heat.  Experiment to what works for you.  Swelling and bruising can take several weeks to resolve.  Pain can take even longer to completely resolve. It is helpful to take an over-the-counter pain medication regularly for the first few weeks.  Choose one of the following that works best for you: Naproxen (Aleve, etc)  Two 220mg  tabs twice a day Ibuprofen (Advil, etc) Three 200mg  tabs four times a day (every meal & bedtime) A  prescription for pain medication (such as percocet, oxycodone, hydrocodone, etc) should be  given to you upon discharge.  Take your pain medication as prescribed.  If you are having problems/concerns with the prescription medicine (does not control pain, nausea, vomiting, rash, itching, etc), please call us [phone] to see if we need to switch you to a different pain medicine that will work better for you and/or control your side effect better. If you need a refill on your pain medication, please contact your pharmacy.  They will contact our office to request authorization. Prescriptions will not be filled after 5 pm or on week-ends. KEEP YOUR BOWELS REGULAR and AVOID CONSTIPATION The goal is one to two soft bowel movements a day.  You should at least have a bowel movement every other day. Avoid getting constipated.  Between the surgery and the pain medications, it is common to experience some constipation. This can be very painful after rectal surgery.  Increasing fluid intake and taking a fiber supplement (such as Metamucil, Citrucel, FiberCon, etc) 1-2 times a day regularly will usually help prevent this problem from occurring.  A stool softener like colace is also recommended.  This can be purchased over the counter at your pharmacy.  You can take it up to 3 times a day.  If you do not have a bowel movement after 24 hrs since your surgery, take one does of milk of magnesia.  If you still haven't had a bowel movement 8-12 hours after that dose, take another dose.  If you don't have a bowel movement 48 hrs after surgery,  purchase a Fleets enema from the drug store and administer gently per package instructions.  If you still are having trouble with your bowel movements after that, please call the office for further instructions. If you develop diarrhea or have many loose bowel movements, simplify your diet to bland foods & liquids for a few days.  Stop any stool softeners and decrease your fiber supplement.  Switching to mild anti-diarrheal medications (Kayopectate, Pepto Bismol) can help.   If this worsens or does not improve, please call us.  Wound Care Remove your bandages before your first bowel movement or 8 hours after surgery.     Remove any wound packing material at this tim,e as well.  You do not need to repack the wound unless instructed otherwise.  Wear an absorbent pad or soft cotton gauze in your underwear to catch any drainage and help keep the area clean. You should change this every 2-3 hours while awake. Keep the area clean and dry.  Bathe / shower every day, especially after bowel movements.  Keep the area clean by showering / bathing over the incision / wound.   It is okay to soak an open wound to help wash it.  Wet wipes or showers / gentle washing after bowel movements is often less traumatic than regular toilet paper. You may have some styrofoam-like soft packing in the rectum which will come out with the first bowel movement.  You will often notice bleeding with bowel movements.  This should slow down by the end of the first week of surgery Expect some drainage.  This should slow down, too, by the end of the first week of surgery.  Wear an absorbent pad or soft cotton gauze in your underwear until the drainage stops. Do Not sit on a rubber or pillow ring.  This can make you symptoms worse.  You may sit on a soft pillow if needed.  ACTIVITIES as tolerated:   You may resume regular (light) daily activities beginning the next day--such as daily self-care, walking, climbing stairs--gradually increasing activities as tolerated.  If you can walk 30 minutes without difficulty, it is safe to try more intense activity such as jogging, treadmill, bicycling, low-impact aerobics, swimming, etc. Save the most intensive and strenuous activity for last such as sit-ups, heavy lifting, contact sports, etc  Refrain from any heavy lifting or straining until you are off narcotics for pain control.   You may drive when you are no longer taking prescription pain medication, you can  comfortably sit for long periods of time, and you can safely maneuver your car and apply brakes. You may have sexual intercourse when it is comfortable.  FOLLOW UP in our office Please call [facility] at [phone] to set up an appointment to see your surgeon in the office for a follow-up appointment approximately 3-4 weeks after your surgery. Make sure that you call for this appointment the day you arrive home to insure a convenient appointment time. 10. IF YOU HAVE DISABILITY OR FAMILY LEAVE FORMS, BRING THEM TO THE OFFICE FOR PROCESSING.  DO NOT GIVE THEM TO YOUR DOCTOR.     WHEN TO CALL us [phone]: Poor pain control Reactions / problems with new medications (rash/itching, nausea, etc)  Fever over 101.5 F (38.5 C) Inability to urinate Nausea and/or vomiting Worsening swelling or bruising Continued bleeding from incision. Increased pain, redness, or drainage from the incision  The clinic staff is available to answer your questions during regular business hours (8:30am-5pm).  Please don't hesitate to call and ask to speak to one of our nurses for clinical concerns.   A surgeon from [facility] is always on call at the hospitals   If you have a medical emergency, go to the nearest emergency room or call 911.    [facility] [address], [place], [place]  [address] ? MAIN: [phone] ? TOLL FREE: [phone] ? FAX [phone] [email or web address]   Post Anesthesia Home Care Instructions  Activity: Get plenty of rest for the remainder of the day. A responsible individual must stay with you for 24 hours following the procedure.  For the next 24 hours, DO NOT: -Drive a car -Advertising copywriter -Drink alcoholic beverages -Take any medication unless instructed by your physician -Make any legal decisions or sign important papers.  Meals: Start with liquid foods such as gelatin or soup. Progress to regular foods  as tolerated. Avoid greasy, spicy, heavy foods. If nausea and/or vomiting occur, drink only clear liquids until the nausea and/or vomiting subsides. Call your physician if vomiting continues.  Special Instructions/Symptoms: Your throat may feel dry or sore from the anesthesia or the breathing tube placed in your throat during surgery. If this causes discomfort, gargle with warm salt water. The discomfort should disappear within 24 hours.  May take Tylenol beginning at 7:30 PM.  Information for Discharge Teaching: EXPAREL (bupivacaine liposome injectable suspension)   Pain relief is important to your recovery. The goal is to control your pain so you can move easier and return to your normal activities as soon as possible after your procedure. Your physician may use several types of medicines to manage pain, swelling, and more.  Your surgeon or anesthesiologist gave you EXPAREL(bupivacaine) to help control your pain after surgery.  EXPAREL is a local anesthetic designed to release slowly over an extended period of time to provide pain relief by numbing the tissue around the surgical site. EXPAREL is designed to release pain medication over time and can control pain for up to 72 hours. Depending on how you respond to EXPAREL, you may require less pain medication during your recovery. EXPAREL can help reduce or eliminate the need for opioids during the first few days after surgery when pain relief is needed the most. EXPAREL is not an opioid and is not addictive. It does not cause sleepiness or sedation.   Important! A teal colored band has been placed on your arm with the date, time and amount of EXPAREL you have received. Please leave this armband in place for the full 96 hours following administration, and then you may remove the band. If you return to the hospital for any reason within 96 hours following the administration of EXPAREL, the armband provides important information that your health  care providers to know, and alerts them that you have received this anesthetic.    Possible side effects of EXPAREL: Temporary loss of sensation or ability to move in the area where medication was injected. Nausea, vomiting, constipation Rarely, numbness and tingling in your mouth or lips, lightheadedness, or anxiety may occur. Call your doctor right away if you think you may be experiencing any of these sensations, or if you have other questions regarding possible side effects.  Follow all other discharge instructions given to you by your surgeon or nurse. Eat a healthy diet and drink plenty of water or other fluids.  Do not remove green armband before Sunday, January 16, 2023.

## 2023-01-12 NOTE — Anesthesia Preprocedure Evaluation (Addendum)
Anesthesia Evaluation  Patient identified by MRN, date of birth, ID band Patient awake    Reviewed: Allergy & Precautions, NPO status , Patient's Chart, lab work & pertinent test results  History of Anesthesia Complications Negative for: history of anesthetic complications  Airway Mallampati: I  TM Distance: >3 FB Neck ROM: Full    Dental  (+) Dental Advisory Given, Teeth Intact   Pulmonary neg pulmonary ROS   Pulmonary exam normal        Cardiovascular hypertension, Pt. on medications Normal cardiovascular exam     Neuro/Psych negative neurological ROS  negative psych ROS   GI/Hepatic Neg liver ROS,,, Anal cancer Hemorrhoids    Endo/Other  negative endocrine ROS    Renal/GU negative Renal ROS     Musculoskeletal  (+) Arthritis ,    Abdominal   Peds  Hematology negative hematology ROS (+)   Anesthesia Other Findings   Reproductive/Obstetrics                             Anesthesia Physical Anesthesia Plan  ASA: 2  Anesthesia Plan: MAC   Post-op Pain Management: Tylenol PO (pre-op)* and Minimal or no pain anticipated   Induction:   PONV Risk Score and Plan: 2 and Propofol infusion and Treatment may vary due to age or medical condition  Airway Management Planned: Nasal Cannula and Natural Airway  Additional Equipment: None  Intra-op Plan:   Post-operative Plan:   Informed Consent: I have reviewed the patients History and Physical, chart, labs and discussed the procedure including the risks, benefits and alternatives for the proposed anesthesia with the patient or authorized representative who has indicated his/her understanding and acceptance.       Plan Discussed with: CRNA and Anesthesiologist  Anesthesia Plan Comments:         Anesthesia Quick Evaluation

## 2023-01-12 NOTE — Op Note (Signed)
01/12/2023  2:54 PM  PATIENT:  Nicole Huynh  67 y.o. female  Patient Care Team: Kendell Bane, MD as PCP - General (Family Medicine)  PRE-OPERATIVE DIAGNOSIS:  GRADE 3 HEMORRHOID  POST-OPERATIVE DIAGNOSIS:  GRADE 3 HEMORRHOID  PROCEDURE:  SINGLE COLUMN HEMORRHOIDECTOMY    Surgeon(s): Romie Levee, MD  ASSISTANT: none   ANESTHESIA:   local and MAC  SPECIMEN:  Source of Specimen:  R anterior hemorrhoid  DISPOSITION OF SPECIMEN:  PATHOLOGY  COUNTS:  YES  PLAN OF CARE: Discharge to home after PACU  PATIENT DISPOSITION:  PACU - hemodynamically stable.  INDICATION: 67 y.o. F with h/o anal cancer who developed rectal bleeding.  Exam revealed a grade 3 hemorrhoid   OR FINDINGS: slight anal stenosis.  Grade 3 RA hemorrhoid  DESCRIPTION: the patient was identified in the preoperative holding area and taken to the OR where they were laid on the operating room table.  MAC anesthesia was induced without difficulty. The patient was then positioned in prone jackknife position with buttocks gently taped apart.  The patient was then prepped and draped in usual sterile fashion.  SCDs were noted to be in place prior to the initiation of anesthesia. A surgical timeout was performed indicating the correct patient, procedure, positioning and need for preoperative antibiotics.  A rectal block was performed using Marcaine with epinephrine mixed with Experel.    I began with a digital rectal exam.  The anal canal was gently dilated.  I then placed a Hill-Ferguson anoscope into the anal canal and evaluated this completely.  The patient had slight anal stenosis.  A medium Hill-Ferguson anoscope was able to be placed but this was too tight to perform a hemorrhoidectomy so I switched to the small scope.  I elevated the right anterior hemorrhoid off of the sphincter complex using retraction.  I then incised underneath the hemorrhoid into the perianal skin using Metzenbaum scissors.  This was carried down  to the level of the sphincter complex.  The sphincters were dissected free from the hemorrhoid tissue and the hemorrhoid was excised completely.  The rectal mucosa was reapproximated with a running 2-0 chromic suture.  The anoderm distal to the dentate line was closed using a running 3-0 chromic suture.  An additional figure-of-eight suture was placed along the anal canal incision for added hemostasis.  Once this was complete there was no further bleeding.  The incision site was injected with local and hemostasis was confirmed.  A dressing was applied.  The patient was then awakened from anesthesia and sent to the postanesthesia care unit in stable condition.  All counts were correct per operating room staff.  [name], MD  Colorectal and General Surgery [facility]

## 2023-01-13 ENCOUNTER — Encounter (HOSPITAL_BASED_OUTPATIENT_CLINIC_OR_DEPARTMENT_OTHER): Payer: Self-pay | Admitting: General Surgery

## 2023-01-13 LAB — SURGICAL PATHOLOGY

## 2023-01-13 NOTE — Anesthesia Postprocedure Evaluation (Signed)
Anesthesia Post Note  Patient: Nicole Huynh  Procedure(s) Performed: SINGLE COLUMN HEMORRHOIDECTOMY (Rectum)     Patient location during evaluation: PACU Anesthesia Type: MAC Level of consciousness: awake and alert Pain management: pain level controlled Vital Signs Assessment: post-procedure vital signs reviewed and stable Respiratory status: spontaneous breathing, nonlabored ventilation and respiratory function stable Cardiovascular status: stable and blood pressure returned to baseline Anesthetic complications: no   No notable events documented.  Last Vitals:  Vitals:   01/12/23 1530 01/12/23 1626  BP: 120/60 (!) 138/94  Pulse: 81 64  Resp: 19 20  Temp:  (!) 36.3 C  SpO2: 100% 98%    Last Pain:  Vitals:   01/13/23 0955  TempSrc:   PainSc: 3                  Beryle Lathe
